# Patient Record
Sex: Male | Born: 1975
Health system: Southern US, Community
[De-identification: ages and names within clinical notes are randomized; demographics above are authoritative.]

## PROBLEM LIST (undated history)

## (undated) DIAGNOSIS — T7840XA Allergy, unspecified, initial encounter: Secondary | ICD-10-CM

## (undated) DIAGNOSIS — I10 Essential (primary) hypertension: Secondary | ICD-10-CM

## (undated) DIAGNOSIS — K5909 Other constipation: Secondary | ICD-10-CM

## (undated) DIAGNOSIS — K5790 Diverticulosis of intestine, part unspecified, without perforation or abscess without bleeding: Secondary | ICD-10-CM

## (undated) DIAGNOSIS — E739 Lactose intolerance, unspecified: Secondary | ICD-10-CM

## (undated) DIAGNOSIS — K5792 Diverticulitis of intestine, part unspecified, without perforation or abscess without bleeding: Secondary | ICD-10-CM

## (undated) DIAGNOSIS — R002 Palpitations: Secondary | ICD-10-CM

## (undated) DIAGNOSIS — I82409 Acute embolism and thrombosis of unspecified deep veins of unspecified lower extremity: Secondary | ICD-10-CM

## (undated) DIAGNOSIS — J45909 Unspecified asthma, uncomplicated: Secondary | ICD-10-CM

## (undated) DIAGNOSIS — E785 Hyperlipidemia, unspecified: Secondary | ICD-10-CM

## (undated) HISTORY — DX: Unspecified asthma, uncomplicated: J45.909

## (undated) HISTORY — DX: Diverticulosis of intestine, part unspecified, without perforation or abscess without bleeding: K57.90

## (undated) HISTORY — DX: Acute embolism and thrombosis of unspecified deep veins of unspecified lower extremity: I82.409

## (undated) HISTORY — PX: JOINT REPLACEMENT: SHX530

## (undated) HISTORY — PX: COLONOSCOPY: SHX174

## (undated) HISTORY — DX: Other constipation: K59.09

## (undated) HISTORY — DX: Diverticulitis of intestine, part unspecified, without perforation or abscess without bleeding: K57.92

## (undated) HISTORY — DX: Hyperlipidemia, unspecified: E78.5

## (undated) HISTORY — PX: OTHER SURGICAL HISTORY: SHX169

## (undated) HISTORY — DX: Lactose intolerance, unspecified: E73.9

## (undated) HISTORY — PX: ROTATOR CUFF REPAIR: SHX139

## (undated) HISTORY — PX: ESOPHAGOGASTRODUODENOSCOPY ENDOSCOPY: SHX5814

## (undated) HISTORY — DX: Allergy, unspecified, initial encounter: T78.40XA

---

## 2000-02-25 HISTORY — PX: BUNIONECTOMY: SHX129

## 2009-05-02 ENCOUNTER — Emergency Department (HOSPITAL_COMMUNITY): Admission: EM | Admit: 2009-05-02 | Discharge: 2009-05-02 | Payer: Self-pay | Admitting: Emergency Medicine

## 2010-05-20 LAB — POCT CARDIAC MARKERS
CKMB, poc: 2.9 ng/mL (ref 1.0–8.0)
CKMB, poc: 3.7 ng/mL (ref 1.0–8.0)
Myoglobin, poc: 188 ng/mL (ref 12–200)
Myoglobin, poc: 207 ng/mL (ref 12–200)
Troponin i, poc: <0.05 ng/mL (ref 0.00–0.09)

## 2010-05-20 LAB — DIFFERENTIAL
Basophils Relative: 0 % (ref 0–1)
Lymphocytes Relative: 31 % (ref 12–46)
Lymphs Abs: 2.1 10*3/uL (ref 0.7–4.0)
Neutro Abs: 4 10*3/uL (ref 1.7–7.7)

## 2010-05-20 LAB — CBC
HCT: 42.8 % (ref 39.0–52.0)
Hemoglobin: 14.2 g/dL (ref 13.0–17.0)
MCHC: 33.3 g/dL (ref 30.0–36.0)
MCV: 86.3 fL (ref 78.0–100.0)
RBC: 4.96 MIL/uL (ref 4.22–5.81)
WBC: 6.7 10*3/uL (ref 4.0–10.5)

## 2010-05-20 LAB — POCT I-STAT, CHEM 8
Calcium, Ion: 1.04 mmol/L — ABNORMAL LOW (ref 1.12–1.32)
Chloride: 107 mEq/L (ref 96–112)
Glucose, Bld: 90 mg/dL (ref 70–99)
HCT: 43 % (ref 39.0–52.0)
Potassium: 4.2 mEq/L (ref 3.5–5.1)
Sodium: 140 mEq/L (ref 135–145)

## 2017-04-20 ENCOUNTER — Other Ambulatory Visit: Payer: Self-pay | Admitting: Family Medicine

## 2017-04-20 DIAGNOSIS — M75100 Unspecified rotator cuff tear or rupture of unspecified shoulder, not specified as traumatic: Secondary | ICD-10-CM

## 2017-04-22 ENCOUNTER — Ambulatory Visit
Admission: RE | Admit: 2017-04-22 | Discharge: 2017-04-22 | Disposition: A | Discharge status: Home / Self Care | Payer: BC Managed Care – PPO | Source: Ambulatory Visit | Attending: Family Medicine | Admitting: Family Medicine

## 2017-04-22 DIAGNOSIS — M75100 Unspecified rotator cuff tear or rupture of unspecified shoulder, not specified as traumatic: Secondary | ICD-10-CM

## 2017-04-24 ENCOUNTER — Other Ambulatory Visit: Payer: Self-pay

## 2017-05-22 ENCOUNTER — Other Ambulatory Visit (HOSPITAL_BASED_OUTPATIENT_CLINIC_OR_DEPARTMENT_OTHER): Payer: Self-pay | Admitting: Family Medicine

## 2017-05-22 ENCOUNTER — Ambulatory Visit (HOSPITAL_BASED_OUTPATIENT_CLINIC_OR_DEPARTMENT_OTHER): Admission: RE | Admit: 2017-05-22 | Payer: BC Managed Care – PPO | Source: Ambulatory Visit

## 2017-05-22 DIAGNOSIS — M79601 Pain in right arm: Secondary | ICD-10-CM

## 2017-05-22 DIAGNOSIS — M7989 Other specified soft tissue disorders: Principal | ICD-10-CM

## 2017-05-23 ENCOUNTER — Emergency Department (HOSPITAL_BASED_OUTPATIENT_CLINIC_OR_DEPARTMENT_OTHER): Payer: BC Managed Care – PPO

## 2017-05-23 ENCOUNTER — Ambulatory Visit (HOSPITAL_BASED_OUTPATIENT_CLINIC_OR_DEPARTMENT_OTHER)
Admission: RE | Admit: 2017-05-23 | Discharge: 2017-05-23 | Disposition: A | Discharge status: Home / Self Care | Payer: BC Managed Care – PPO | Source: Ambulatory Visit | Attending: Family Medicine | Admitting: Family Medicine

## 2017-05-23 ENCOUNTER — Encounter (HOSPITAL_BASED_OUTPATIENT_CLINIC_OR_DEPARTMENT_OTHER): Payer: Self-pay

## 2017-05-23 ENCOUNTER — Emergency Department (HOSPITAL_BASED_OUTPATIENT_CLINIC_OR_DEPARTMENT_OTHER)
Admission: EM | Admit: 2017-05-23 | Discharge: 2017-05-23 | Disposition: A | Discharge status: Home / Self Care | Payer: BC Managed Care – PPO | Attending: Emergency Medicine | Admitting: Emergency Medicine

## 2017-05-23 ENCOUNTER — Other Ambulatory Visit: Payer: Self-pay

## 2017-05-23 DIAGNOSIS — I82621 Acute embolism and thrombosis of deep veins of right upper extremity: Secondary | ICD-10-CM

## 2017-05-23 DIAGNOSIS — M7989 Other specified soft tissue disorders: Principal | ICD-10-CM

## 2017-05-23 DIAGNOSIS — I82890 Acute embolism and thrombosis of other specified veins: Secondary | ICD-10-CM | POA: Insufficient documentation

## 2017-05-23 DIAGNOSIS — R000 Tachycardia, unspecified: Secondary | ICD-10-CM | POA: Diagnosis not present

## 2017-05-23 DIAGNOSIS — I10 Essential (primary) hypertension: Secondary | ICD-10-CM | POA: Diagnosis not present

## 2017-05-23 DIAGNOSIS — M79601 Pain in right arm: Secondary | ICD-10-CM

## 2017-05-23 DIAGNOSIS — I82411 Acute embolism and thrombosis of right femoral vein: Secondary | ICD-10-CM | POA: Insufficient documentation

## 2017-05-23 DIAGNOSIS — R2231 Localized swelling, mass and lump, right upper limb: Secondary | ICD-10-CM | POA: Diagnosis present

## 2017-05-23 HISTORY — DX: Essential (primary) hypertension: I10

## 2017-05-23 LAB — BASIC METABOLIC PANEL
Anion gap: 9 (ref 5–15)
BUN: 17 mg/dL (ref 6–20)
CALCIUM: 9.1 mg/dL (ref 8.9–10.3)
CO2: 25 mmol/L (ref 22–32)
CREATININE: 1.01 mg/dL (ref 0.61–1.24)
Chloride: 104 mmol/L (ref 101–111)
GFR calc Af Amer: >60 mL/min (ref >60)
GFR calc non Af Amer: >60 mL/min (ref >60)
GLUCOSE: 114 mg/dL — AB (ref 65–99)
Potassium: 3.8 mmol/L (ref 3.5–5.1)
Sodium: 138 mmol/L (ref 135–145)

## 2017-05-23 MED ORDER — RIVAROXABAN (XARELTO) VTE STARTER PACK (15 & 20 MG): ORAL_TABLET | ORAL | 0 refills | Status: DC

## 2017-05-23 MED ORDER — APIXABAN (ELIQUIS) EDUCATION KIT FOR DVT/PE PATIENTS: PACK | 0 refills | Status: DC

## 2017-05-23 MED ORDER — RIVAROXABAN 15 MG PO TABS
15.0000 mg | ORAL_TABLET | Freq: Once | ORAL | Status: AC
  Administered 2017-05-23: 15 mg via ORAL
  Filled 2017-05-23: qty 1

## 2017-05-23 MED ORDER — IOPAMIDOL (ISOVUE-370) INJECTION 76%
100.0000 mL | Freq: Once | INTRAVENOUS | Status: AC | PRN
  Administered 2017-05-23: 100 mL via INTRAVENOUS

## 2017-05-23 NOTE — ED Notes (Signed)
ED Provider at bedside. 

## 2017-05-23 NOTE — ED Triage Notes (Signed)
Pt with recent rotator cuff surgery on 3/11. Upper arm and forearm tenderness x 1 week. Had US performed today that showed DVT and superficial clot.

## 2017-05-23 NOTE — ED Provider Notes (Addendum)
[facility]ERGENCY DEPARTMENT Provider Note   CSN: [id number] Arrival date & time: 05/23/17  1642     History   Chief Complaint Chief Complaint  Patient presents with  . Arm Pain    HPI Mark Alvarado is a 42 y.o. male.  Complaint is DVT of right arm on ultrasound  HPI 42 year old male.  He underwent arthroscopic right rotator cuff repair 19 days ago.  Over the last 4-5 days had increasing pain and swelling of his right upper, and lower arm.  No numbness.  Wears a ring on his right hand.  This is not been tighter markedly swollen.  No numbness.  No difficulty with use of the extremity.  He is undergoing physical therapy.  His shoulder pain has improved markedly.  Past Medical History:  Diagnosis Date  . Hypertension     There are no active problems to display for this patient.   Past Surgical History:  Procedure Laterality Date  . JOINT REPLACEMENT          Home Medications    Prior to Admission medications   Medication Sig Start Date End Date Taking? Authorizing Provider  apixaban (ELIQUIS) KIT 10 mg po bid x 7 days, then 5 mg po bid x 30 days 05/23/17   Tanna Furry, MD    Family History No family history on file.  Social History Social History   Tobacco Use  . Smoking status: Never Smoker  . Smokeless tobacco: Never Used  Substance Use Topics  . Alcohol use: Yes  . Drug use: Not on file     Allergies   Patient has no known allergies.   Review of Systems Review of Systems  Constitutional: Negative for appetite change, chills, diaphoresis, fatigue and fever.  HENT: Negative for mouth sores, sore throat and trouble swallowing.   Eyes: Negative for visual disturbance.  Respiratory: Negative for cough, chest tightness, shortness of breath and wheezing.   Cardiovascular: Negative for chest pain.  Gastrointestinal: Negative for abdominal distention, abdominal pain, diarrhea, nausea and vomiting.  Endocrine: Negative for polydipsia, polyphagia  and polyuria.  Genitourinary: Negative for dysuria, frequency and hematuria.  Musculoskeletal: Negative for gait problem.       Right upper, and lower arm pain and swelling.  Skin: Negative for color change, pallor and rash.  Neurological: Negative for dizziness, syncope, light-headedness and headaches.  Hematological: Does not bruise/bleed easily.  Psychiatric/Behavioral: Negative for behavioral problems and confusion.     Physical Exam Updated Vital Signs BP (!) 144/97 (BP Location: Left Arm)   Pulse 100   Temp 98.4 F (36.9 C) (Oral)   Resp 18   Ht 6' (1.829 m)   Wt 123.4 kg (272 lb)   SpO2 96%   BMI 36.89 kg/m   Physical Exam  Constitutional: He is oriented to person, place, and time. He appears well-developed and well-nourished. No distress.  HENT:  Head: Normocephalic.  Eyes: Pupils are equal, round, and reactive to light. Conjunctivae are normal. No scleral icterus.  Neck: Normal range of motion. Neck supple. No thyromegaly present.  Cardiovascular: Normal rate and regular rhythm. Exam reveals no gallop and no friction rub.  No murmur heard. Pulmonary/Chest: Effort normal and breath sounds normal. No respiratory distress. He has no wheezes. He has no rales.  Abdominal: Soft. Bowel sounds are normal. He exhibits no distension. There is no tenderness. There is no rebound.  Musculoskeletal: Normal range of motion.  Subtle swelling of the right upper, and lower arm  on the right.  No swelling in the hand.  Normal pulses.  Soft compartments.  Normal strength and sensation to radial, ulnar, and median distributions.  Neurological: He is alert and oriented to person, place, and time.  Skin: Skin is warm and dry. No rash noted.  Psychiatric: He has a normal mood and affect. His behavior is normal.     ED Treatments / Results  Labs (all labs ordered are listed, but only abnormal results are displayed) Labs Reviewed  BASIC METABOLIC PANEL - Abnormal; Notable for the  following components:      Result Value   Glucose, Bld 114 (*)    All other components within normal limits    EKG None  Radiology Ct Angio Chest Pe W And/or Wo Contrast  Result Date: 05/23/2017 CLINICAL DATA:  Right upper extremity DVT on Doppler evaluation from earlier today. Recent rotator cuff surgery. EXAM: CT ANGIOGRAPHY CHEST WITH CONTRAST TECHNIQUE: Multidetector CT imaging of the chest was performed using the standard protocol during bolus administration of intravenous contrast. Multiplanar CT image reconstructions and MIPs were obtained to evaluate the vascular anatomy. CONTRAST:  137m ISOVUE-370 IOPAMIDOL (ISOVUE-370) INJECTION 76% COMPARISON:  05/02/2009 chest radiograph. FINDINGS: Cardiovascular: The study is low quality for the evaluation of pulmonary embolism, limited by significant motion degradation and by poor contrast opacification of the pulmonary arteries. There are no convincing filling defects in the central pulmonary arteries. The lobar, segmental and subsegmental pulmonary arteries are poorly evaluated on this scan. Normal course and caliber of the thoracic aorta. Main pulmonary artery diameter 3.0 cm, top-normal. Normal heart size. No significant pericardial fluid/thickening. Mediastinum/Nodes: No discrete thyroid nodules. Unremarkable esophagus. No pathologically enlarged axillary, mediastinal or hilar lymph nodes. Lungs/Pleura: No pneumothorax. No pleural effusion. No acute consolidative airspace disease, lung masses or significant pulmonary nodules. Upper abdomen: No acute abnormality. Musculoskeletal: No aggressive appearing focal osseous lesions. Mild gynecomastia, asymmetric to the right. Mild thoracic spondylosis. Review of the MIP images confirms the above findings. IMPRESSION: 1. Technically very limited scan. No evidence of central pulmonary embolism. 2. No active pulmonary disease. 3. Mild gynecomastia, asymmetric to the right. Electronically Signed   By: JIlona SorrelM.D.   On: 05/23/2017 18:10   [place]Venous Img Upper Uni Right  Result Date: 05/23/2017 CLINICAL DATA:  Right forearm burning sensation, pain EXAM: RIGHT UPPER EXTREMITY VENOUS DOPPLER ULTRASOUND TECHNIQUE: Gray-scale sonography with graded compression, as well as color Doppler and duplex ultrasound were performed to evaluate the upper extremity deep venous system from the level of the subclavian vein and including the jugular, axillary, basilic, radial, ulnar and upper cephalic vein. Spectral Doppler was utilized to evaluate flow at rest and with distal augmentation maneuvers. COMPARISON:  None. FINDINGS: Contralateral Subclavian Vein: Respiratory phasicity is normal and symmetric with the symptomatic side. No evidence of thrombus. Normal compressibility. Internal Jugular Vein: No evidence of thrombus. Normal compressibility, respiratory phasicity and response to augmentation. Subclavian Vein: No evidence of thrombus. Normal compressibility, respiratory phasicity and response to augmentation. Axillary Vein: No evidence of thrombus. Normal compressibility, respiratory phasicity and response to augmentation. Cephalic Vein: Hypoechoic occlusive appearing thrombus in the cephalic vein at the antecubital fossa extending into the upper arm. More peripheral cephalic vein in the forearm is patent. Basilic Vein: No evidence of thrombus. Normal compressibility, respiratory phasicity and response to augmentation. Brachial Veins: Hypoechoic nonocclusive intraluminal thrombus. Vessel is partially compressible. Thrombus appears acute and extends throughout the right brachial veins. Radial Veins: No evidence of thrombus. Normal compressibility,  respiratory phasicity and response to augmentation. Ulnar Veins: No evidence of thrombus. Normal compressibility, respiratory phasicity and response to augmentation. Venous Reflux:  None visualized. Other Findings:  None visualized. IMPRESSION: Positive exam for acute appearing right  upper extremity nonocclusive brachial DVT without propagation into the axillary and subclavian veins. Associated occlusive superficial thrombosis of the cephalic vein at the antecubital fossa. Electronically Signed   By: Jerilynn Mages.  Shick M.D.   On: 05/23/2017 15:19    Procedures Procedures (including critical care time)  Medications Ordered in ED Medications  iopamidol (ISOVUE-370) 76 % injection 100 mL (100 mLs Intravenous Contrast Given 05/23/17 1725)     Initial Impression / Assessment and Plan / ED Course  I have reviewed the triage vital signs and the nursing notes.  Pertinent labs & imaging results that were available during my care of the patient were reviewed by me and considered in my medical decision making (see chart for details).   Does have resting tachycardia.  Is not febrile.  CT angios limited by size and motion.  However, has venous NO central PE.  I had a discussion with him about the limited exam.  I am comfortable with him being discharged without further studies.  If he does have small peripheral PE he is not hypoxemic.  Shows no signs or symptoms suggestive of strain.  Is not dyspneic.  He is being treated with blood thinners.  Will use Xarelto.  Ask for surgery follow-up.  ER with acute changes.  Gust with him risk and benefits of Xarelto.  We discussed avoiding aspirin anti-inflammatories.  We discussed minimizing his risk for trauma and ER follow-up with bleeding of or trauma of any type.  Final Clinical Impressions(s) / ED Diagnoses   Final diagnoses:  Acute deep vein thrombosis (DVT) of femoral vein of right lower extremity [facility]    ED Discharge Orders        Ordered    apixaban (ELIQUIS) KIT     05/23/17 1842        Tanna Furry, MD 05/23/17 1846    Tanna Furry, MD 05/26/17 0008

## 2017-05-23 NOTE — Discharge Instructions (Addendum)
Xarelto prescription as prescribed. Do not take aspirin, or any anti-inflammatories. Tylenol for pain. Follow-up with Dr. Donzetta Matters vascular surgery.  Call for appointment.

## 2017-06-04 ENCOUNTER — Other Ambulatory Visit: Payer: Self-pay

## 2017-06-04 ENCOUNTER — Ambulatory Visit: Payer: BC Managed Care – PPO | Admitting: Vascular Surgery

## 2017-06-04 ENCOUNTER — Encounter: Payer: Self-pay | Admitting: Vascular Surgery

## 2017-06-04 VITALS — BP 130/83 | HR 96 | Resp 20 | Ht 72.0 in | Wt 272.0 lb

## 2017-06-04 DIAGNOSIS — I82621 Acute embolism and thrombosis of deep veins of right upper extremity: Secondary | ICD-10-CM | POA: Diagnosis not present

## 2017-06-04 NOTE — Progress Notes (Signed)
Patient ID: STELLAN VICK, male   DOB: [birth year], 42 y.o.   MRN: [id number]  Reason for Consult: New Patient (Initial Visit) (eval DVT RU )   Referred by Leighton Ruff, MD  Subjective:     HPI:  ALYX GEE is a 42 y.o. male who had a injury of his rotator cuff while doing bench press and then subsequently underwent rotator cuff repair in March.  He then presented to his primary care doctor having nonspecific pains in his right upper arm with associated swelling.  Ultrasound at that time demonstrated a DVT as well as superficial venous thrombosis in his right upper extremity.  He was prescribed Xarelto for 3 months which he is still taking.  At the time he is also taking aspirin for perioperative pain control.  Since that time his swelling has improved and nearly resolved.  His only complaint at this time is numbness on the medial aspect of his right upper arm.  He does not have any vaginal or family history of DVT.  He is no longer taking aspirin only on Xarelto.  He states that he does not have any medical problems.  Past Medical History:  Diagnosis Date  . DVT (deep venous thrombosis) ([place])   . Hypertension    History reviewed. No pertinent family history. Past Surgical History:  Procedure Laterality Date  . JOINT REPLACEMENT      Short Social History:  Social History   Tobacco Use  . Smoking status: Never Smoker  . Smokeless tobacco: Never Used  Substance Use Topics  . Alcohol use: Yes    No Known Allergies  Current Outpatient Medications  Medication Sig Dispense Refill  . Acetaminophen (TYLENOL EXTRA STRENGTH PO) Take by mouth.    . methocarbamol (ROBAXIN) 500 MG tablet TK 1 T PO Q 8 H PRF SPASM  0  . oxyCODONE-acetaminophen (PERCOCET/ROXICET) 5-325 MG tablet TK 1 TO 2 TS PO Q 4 TO 6 HOURS PRN FOR SEVERE PAIN  0  . Rivaroxaban 15 & 20 MG TBPK Take as directed on package: Start with one 61m tablet by mouth twice a day with food. On Day 22, switch to one 224mtablet  once a day with food. 51 each 0  . apixaban (ELIQUIS) KIT 10 mg po bid x 7 days, then 5 mg po bid x 30 days (Patient not taking: Reported on 06/04/2017) 1 kit 0   No current facility-administered medications for this visit.     Review of Systems  Constitutional:  Constitutional negative. HENT: HENT negative.  Eyes: Eyes negative.  Respiratory: Respiratory negative.  Cardiovascular: Cardiovascular negative.  GI: Gastrointestinal negative.  Musculoskeletal: Positive for joint pain.  Neurological: Neurological negative. Hematologic: Hematologic/lymphatic negative.  Psychiatric: Psychiatric negative.        Objective:  Objective   Vitals:   06/04/17 1443  BP: 130/83  Pulse: 96  Resp: 20  SpO2: 95%  Weight: 272 lb (123.4 kg)  Height: 6' (1.829 m)   Body mass index is 36.89 kg/m.  Physical Exam  Constitutional: He is oriented to person, place, and time. He appears well-developed and well-nourished.  HENT:  Head: Normocephalic.  Eyes: Pupils are equal, round, and reactive to light.  Neck: Normal range of motion.  Cardiovascular: Normal rate.  Pulmonary/Chest: Effort normal.  Abdominal: Soft.  Musculoskeletal: Normal range of motion. He exhibits no edema.  Neurological: He is alert and oriented to person, place, and time.  Skin: Skin is warm and dry.  Psychiatric:  He has a normal mood and affect. His behavior is normal. Judgment and thought content normal.    Data: I reviewed his right upper extremity venous duplex which demonstrated a brachial vein thrombus as well as a superficial venous thrombus in his cephalic vein on the right.     Assessment/Plan:     42 year old male with a history of right arm rotator cuff repair and subsequent deep venous thrombosis in his right upper extremity.  He is now on Xarelto with a plan to take for 3 months.  We discussed with him that this is a provoked DVT and he does not require longer anticoagulation but that given that he now has  a DVT he is at high risk of future DVT as well.  From this standpoint further surgeries should consider perioperative anticoagulation and he also he needs to take care when traveling and remain up-to-date on his cancer screenings throughout time.  I would recommend aspirin baby 81 mg for lifelong or until the risks outweigh the benefits particularly after he stopped Xarelto in 3 months.  If he has swelling of the right upper extremity he can wear gentle compression.  I can see him on an as-needed basis.     Waynetta Sandy MD [facility]

## 2017-06-15 ENCOUNTER — Telehealth: Payer: Self-pay | Admitting: Hematology and Oncology

## 2017-06-15 NOTE — Telephone Encounter (Signed)
Appointment scheduled Patient notified of date/time/location/phone number

## 2017-06-18 ENCOUNTER — Telehealth: Payer: Self-pay | Admitting: Hematology and Oncology

## 2017-06-18 ENCOUNTER — Encounter: Payer: Self-pay | Admitting: Hematology and Oncology

## 2017-06-18 NOTE — Telephone Encounter (Signed)
Pt's appt has been moved to 11am to see Dr. Lebron Conners. Pt was ok with the appt change. Letter mailed.

## 2017-06-30 ENCOUNTER — Telehealth: Payer: Self-pay | Admitting: Hematology and Oncology

## 2017-06-30 ENCOUNTER — Encounter: Payer: BC Managed Care – PPO | Admitting: Hematology and Oncology

## 2017-06-30 ENCOUNTER — Inpatient Hospital Stay: Payer: BC Managed Care – PPO | Admitting: Hematology and Oncology

## 2017-06-30 ENCOUNTER — Telehealth: Payer: Self-pay

## 2017-06-30 NOTE — Telephone Encounter (Signed)
Per patient request to change appointment date due to a meeting at his job. Per 5/7 phone message return

## 2017-06-30 NOTE — Telephone Encounter (Signed)
Tried to call regarding voicemail  °

## 2017-07-07 ENCOUNTER — Inpatient Hospital Stay: Payer: BC Managed Care – PPO | Attending: Hematology and Oncology | Admitting: Hematology and Oncology

## 2017-07-07 ENCOUNTER — Encounter: Payer: Self-pay | Admitting: Hematology and Oncology

## 2017-07-07 VITALS — BP 136/85 | HR 82 | Temp 98.4°F | Resp 17 | Ht 72.0 in | Wt 275.2 lb

## 2017-07-07 DIAGNOSIS — Z7901 Long term (current) use of anticoagulants: Secondary | ICD-10-CM | POA: Insufficient documentation

## 2017-07-07 DIAGNOSIS — Z809 Family history of malignant neoplasm, unspecified: Secondary | ICD-10-CM | POA: Insufficient documentation

## 2017-07-07 DIAGNOSIS — I82621 Acute embolism and thrombosis of deep veins of right upper extremity: Secondary | ICD-10-CM | POA: Insufficient documentation

## 2017-07-07 DIAGNOSIS — I10 Essential (primary) hypertension: Secondary | ICD-10-CM

## 2017-07-07 DIAGNOSIS — I8290 Acute embolism and thrombosis of unspecified vein: Secondary | ICD-10-CM

## 2017-07-07 DIAGNOSIS — I82611 Acute embolism and thrombosis of superficial veins of right upper extremity: Secondary | ICD-10-CM | POA: Diagnosis not present

## 2017-07-08 ENCOUNTER — Telehealth: Payer: Self-pay | Admitting: Hematology and Oncology

## 2017-07-08 NOTE — Telephone Encounter (Signed)
Return if symptoms worsen or fail to improve.per 5/15 los

## 2017-07-23 DIAGNOSIS — Z86718 Personal history of other venous thrombosis and embolism: Secondary | ICD-10-CM | POA: Insufficient documentation

## 2017-07-23 DIAGNOSIS — I8290 Acute embolism and thrombosis of unspecified vein: Secondary | ICD-10-CM | POA: Insufficient documentation

## 2017-07-23 NOTE — Progress Notes (Signed)
[name]:  Assessment: VTE (venous thromboembolism) 42 y.o. male with clearly provoked venous thromboembolism in the right upper extremity following joint surgery on the same arm.  Started on therapeutic anticoagulation with symptom resolution.  No previous history of venous thrombosis noted.  Recommendations: - Recommend 3 months of therapeutic anticoagulation on account of small volume and provoked nature of the thrombus. - No need for additional thrombophilia evaluation at this time. - Return to our clinic as needed.   Voice recognition software was used and creation of this note. Despite my best effort at editing the text, some misspelling/errors may have occurred. No orders of the defined types were placed in this encounter.   All questions were answered. . The patient knows to call the clinic with any problems, questions or concerns.  This note was electronically signed.    History of Presenting Illness Mark Alvarado 42 y.o. presenting to the [facility] for recent history of deep vein thrombosis in the right upper extremity, referred by Dr. Leighton Alvarado.  The exact reason for referral is not clear from the provided documents.  I assume it has to do with recommendations regarding the duration of anticoagulation warranted for the presentation.  Patient's past medical history is significant for history of hypertension, colon polyps with last colonoscopy obtained in 2009.  Patient has family history of cancer in his sister, but he does not know the type.  Patient is a non-smoker and does not drink alcohol to excess.  Presenting symptoms and labs/evaluations are outlined below.  Since initiation of anticoagulation, patient has had complete resolution of the symptoms on the affected extremity.  Oncological/hematological History: **VTE, DVT RtUE: --Event #1, 05/22/17: Presented with burning pain in the forearm and antecubital tenderness following a  rotator cuff surgery repair on 05/04/2017.  --Doppler US RtUE, 05/23/17: Occlusive thrombus of the cephalic vein with additional nonocclusive thrombus in the brachial vein.  --CTA Chest, 05/23/17: No evidence of central pulmonary embolism  --Treatment:    Apixaban, 03/30-04/11/19   Rivaroxaban, 06/04/17-   Medical History: Past Medical History:  Diagnosis Date  . DVT (deep venous thrombosis) ([place])   . Hypertension     Surgical History: Past Surgical History:  Procedure Laterality Date  . JOINT REPLACEMENT    . ROTATOR CUFF REPAIR Right     Family History: Family History  Problem Relation Age of Onset  . Cancer Mother   . Cancer Father   . Cancer Sister   . Cancer Maternal Aunt   . Cancer Paternal Uncle   . Cancer Maternal Grandmother     Social History: Social History   Socioeconomic History  . Marital status: Married    Spouse name: Not on file  . Number of children: Not on file  . Years of education: Not on file  . Highest education level: Not on file  Occupational History  . Not on file  Social Needs  . Financial resource strain: Not on file  . Food insecurity:    Worry: Not on file    Inability: Not on file  . Transportation needs:    Medical: Not on file    Non-medical: Not on file  Tobacco Use  . Smoking status: Never Smoker  . Smokeless tobacco: Never Used  Substance and Sexual Activity  . Alcohol use: Yes  . Drug use: Never  . Sexual activity: Yes  Lifestyle  . Physical activity:    Days per week: Not on file  Minutes per session: Not on file  . Stress: Not on file  Relationships  . Social connections:    Talks on phone: Not on file    Gets together: Not on file    Attends religious service: Not on file    Active member of club or organization: Not on file    Attends meetings of clubs or organizations: Not on file    Relationship status: Not on file  . Intimate partner violence:    Fear of current or ex partner: Not on file     Emotionally abused: Not on file    Physically abused: Not on file    Forced sexual activity: Not on file  Other Topics Concern  . Not on file  Social History Narrative  . Not on file    Allergies: No Known Allergies  Medications:  Current Outpatient Medications  Medication Sig Dispense Refill  . Acetaminophen (TYLENOL EXTRA STRENGTH PO) Take by mouth.    Marland Kitchen apixaban (ELIQUIS) KIT 10 mg po bid x 7 days, then 5 mg po bid x 30 days (Patient not taking: Reported on 06/04/2017) 1 kit 0  . methocarbamol (ROBAXIN) 500 MG tablet TK 1 T PO Q 8 H PRF SPASM  0  . oxyCODONE-acetaminophen (PERCOCET/ROXICET) 5-325 MG tablet TK 1 TO 2 TS PO Q 4 TO 6 HOURS PRN FOR SEVERE PAIN  0  . Rivaroxaban 15 & 20 MG TBPK Take as directed on package: Start with one '15mg'$  tablet by mouth twice a day with food. On Day 22, switch to one '20mg'$  tablet once a day with food. 51 each 0   No current facility-administered medications for this visit.     Review of Systems: Review of Systems  All other systems reviewed and are negative.    PHYSICAL EXAMINATION Blood pressure 136/85, pulse 82, temperature 98.4 F (36.9 C), temperature source Oral, resp. rate 17, height 6' (1.829 m), weight 275 lb 3.2 oz (124.8 kg), SpO2 98 %.  ECOG PERFORMANCE STATUS: 0 - Asymptomatic  Physical Exam  Constitutional: He is oriented to person, place, and time. He appears well-developed and well-nourished. No distress.  HENT:  Head: Normocephalic and atraumatic.  Mouth/Throat: Oropharynx is clear and moist. No oropharyngeal exudate.  Eyes: Pupils are equal, round, and reactive to light. Conjunctivae and EOM are normal. No scleral icterus.  Neck: No thyromegaly present.  Cardiovascular: Normal rate, regular rhythm, normal heart sounds and intact distal pulses. Exam reveals no gallop and no friction rub.  No murmur heard. Pulmonary/Chest: Effort normal and breath sounds normal. No stridor. No respiratory distress. He has no wheezes. He  has no rales.  Abdominal: Soft. Bowel sounds are normal. He exhibits no distension and no mass. There is no tenderness. There is no guarding.  Musculoskeletal: He exhibits no edema.  Lymphadenopathy:    He has no cervical adenopathy.  Neurological: He is alert and oriented to person, place, and time. He displays normal reflexes. No cranial nerve deficit or sensory deficit.  Skin: Skin is warm and dry. No rash noted. He is not diaphoretic. No erythema. No pallor.     LABORATORY DATA: I have personally reviewed the data as listed: No visits with results within 1 Week(s) from this visit.  Latest known visit with results is:  Admission on 05/23/2017, Discharged on 05/23/2017  Component Date Value Ref Range Status  . Sodium 05/23/2017 138  135 - 145 mmol/L Final  . Potassium 05/23/2017 3.8  3.5 - 5.1 mmol/L Final  .  Chloride 05/23/2017 104  101 - 111 mmol/L Final  . CO2 05/23/2017 25  22 - 32 mmol/L Final  . Glucose, Bld 05/23/2017 114* 65 - 99 mg/dL Final  . BUN 05/23/2017 17  6 - 20 mg/dL Final  . Creatinine, Ser 05/23/2017 1.01  0.61 - 1.24 mg/dL Final  . Calcium 05/23/2017 9.1  8.9 - 10.3 mg/dL Final  . GFR calc non Af Amer 05/23/2017 >60  >60 mL/min Final  . GFR calc Af Amer 05/23/2017 >60  >60 mL/min Final   Comment: (NOTE) The eGFR has been calculated using the CKD EPI equation. This calculation has not been validated in all clinical situations. eGFR's persistently <60 mL/min signify possible Chronic Kidney Disease.   Georgiann Hahn gap 05/23/2017 9  5 - 15 Final   Performed at [facility], [address], [place], [place] [address]         Ardath Sax, MD

## 2017-07-23 NOTE — Assessment & Plan Note (Signed)
42 y.o. male with clearly provoked venous thromboembolism in the right upper extremity following joint surgery on the same arm.  Started on therapeutic anticoagulation with symptom resolution.  No previous history of venous thrombosis noted.  Recommendations: - Recommend 3 months of therapeutic anticoagulation on account of small volume and provoked nature of the thrombus. - No need for additional thrombophilia evaluation at this time. - Return to our clinic as needed.

## 2017-10-23 LAB — LIPID PANEL
Cholesterol: 190 (ref 0–200)
HDL: 32 — AB (ref 35–70)
LDL Cholesterol: 124
Triglycerides: 172 — AB (ref 40–160)

## 2017-10-25 LAB — CBC: RBC: 5.09 (ref 3.87–5.11)

## 2017-10-25 LAB — BASIC METABOLIC PANEL
BUN: 13 (ref 4–21)
CO2: 28 — AB (ref 13–22)
Chloride: 104 (ref 99–108)
Creatinine: 1 (ref 0.6–1.3)
Glucose: 86
Potassium: 5.1 (ref 3.4–5.3)
Sodium: 138 (ref 137–147)

## 2017-10-25 LAB — COMPREHENSIVE METABOLIC PANEL
Calcium: 9.5 (ref 8.7–10.7)
GFR calc Af Amer: 99
GFR calc non Af Amer: 82

## 2017-10-25 LAB — CBC AND DIFFERENTIAL
HCT: 43 (ref 41–53)
Hemoglobin: 14.1 (ref 13.5–17.5)
Neutrophils Absolute: 52
WBC: 7.3

## 2017-10-25 LAB — HEPATIC FUNCTION PANEL
ALT: 22 (ref 10–40)
AST: 23 (ref 14–40)
Alkaline Phosphatase: 46 (ref 25–125)
Bilirubin, Total: 0.5

## 2018-10-21 ENCOUNTER — Other Ambulatory Visit (HOSPITAL_BASED_OUTPATIENT_CLINIC_OR_DEPARTMENT_OTHER): Payer: Self-pay | Admitting: Family Medicine

## 2018-10-21 DIAGNOSIS — M79602 Pain in left arm: Secondary | ICD-10-CM

## 2018-10-21 DIAGNOSIS — M79605 Pain in left leg: Secondary | ICD-10-CM

## 2018-10-22 ENCOUNTER — Ambulatory Visit (HOSPITAL_BASED_OUTPATIENT_CLINIC_OR_DEPARTMENT_OTHER)
Admission: RE | Admit: 2018-10-22 | Discharge: 2018-10-22 | Disposition: A | Discharge status: Home / Self Care | Payer: BC Managed Care – PPO | Source: Ambulatory Visit | Attending: Family Medicine | Admitting: Family Medicine

## 2018-10-22 ENCOUNTER — Other Ambulatory Visit: Payer: Self-pay

## 2018-10-22 DIAGNOSIS — M79602 Pain in left arm: Secondary | ICD-10-CM | POA: Insufficient documentation

## 2018-10-22 DIAGNOSIS — M79605 Pain in left leg: Secondary | ICD-10-CM

## 2018-10-27 LAB — BASIC METABOLIC PANEL
BUN: 18 (ref 4–21)
CO2: 29 — AB (ref 13–22)
Chloride: 105 (ref 99–108)
Creatinine: 1.1 (ref 0.6–1.3)
Glucose: 81
Potassium: 4.6 (ref 3.4–5.3)
Sodium: 141 (ref 137–147)

## 2018-10-27 LAB — HEPATIC FUNCTION PANEL
ALT: 18 (ref 10–40)
AST: 20 (ref 14–40)
Alkaline Phosphatase: 52 (ref 25–125)
Bilirubin, Total: 0.8

## 2018-10-27 LAB — COMPREHENSIVE METABOLIC PANEL
Calcium: 9.7 (ref 8.7–10.7)
GFR calc Af Amer: 87
GFR calc non Af Amer: 72

## 2018-10-29 LAB — LIPID PANEL
Cholesterol: 197 (ref 0–200)
HDL: 38 (ref 35–70)
LDL Cholesterol: 121
Triglycerides: 194 — AB (ref 40–160)

## 2019-04-28 ENCOUNTER — Ambulatory Visit: Payer: Self-pay | Attending: Family

## 2019-04-28 DIAGNOSIS — Z23 Encounter for immunization: Secondary | ICD-10-CM | POA: Insufficient documentation

## 2019-04-28 NOTE — Progress Notes (Signed)
   Covid-19 Vaccination Clinic  Name:  Mark Alvarado    MRN: [id number] DOB: 1975/08/10  04/28/2019  Mr. Mark Alvarado was observed post Covid-19 immunization for 15 minutes without incident. He was provided with Vaccine Information Sheet and instruction to access the V-Safe system.   Mr. Mark Alvarado was instructed to call 911 with any severe reactions post vaccine: Marland Kitchen Difficulty breathing  . Swelling of face and throat  . A fast heartbeat  . A bad rash all over body  . Dizziness and weakness   Immunizations Administered    Name Date Dose VIS Date Route   Moderna COVID-19 Vaccine 04/28/2019  1:21 PM 0.5 mL 01/25/2019 Intramuscular   Manufacturer: Moderna   Lot: [id number]   [id number]

## 2019-05-31 ENCOUNTER — Ambulatory Visit: Payer: Self-pay | Attending: Family

## 2019-05-31 DIAGNOSIS — Z23 Encounter for immunization: Secondary | ICD-10-CM

## 2019-05-31 NOTE — Progress Notes (Signed)
   Covid-19 Vaccination Clinic  Name:  Mark Alvarado    MRN: [id number] DOB: 01-22-1976  05/31/2019  Mark Alvarado was observed post Covid-19 immunization for 15 minutes without incident. He was provided with Vaccine Information Sheet and instruction to access the V-Safe system.   Mark Alvarado was instructed to call 911 with any severe reactions post vaccine: Marland Kitchen Difficulty breathing  . Swelling of face and throat  . A fast heartbeat  . A bad rash all over body  . Dizziness and weakness   Immunizations Administered    Name Date Dose VIS Date Route   Moderna COVID-19 Vaccine 05/31/2019  1:17 PM 0.5 mL 01/25/2019 Intramuscular   Manufacturer: Moderna   Lot: [id number]   [place][id number]

## 2019-12-23 NOTE — Patient Instructions (Addendum)
Health Maintenance Due  Topic Date Due   INFLUENZA VACCINE In office flu shot today Never done   If arm fails to continue to improve  Over the next few weeks- let me get you in with Dr. Tamala Julian or Dr. Georgina Snell of sports medicine  Lets schedule a physical 3-4 months and lets update bloodwork at that time.   Removed skin tags today. Monitor for bleeding/signs of infection such as expanding redness- let me know if this occurs  Work on 10-15 lbs weight loss by follow up. Consider F3 [place]. Ask Langley Gauss all about it!   We will call you within two weeks about your referral to GI/colonoscopy. If you do not hear within 3 weeks, give [place] a call.

## 2019-12-23 NOTE — Progress Notes (Signed)
Phone: [phone]   Subjective:  Patient presents today to establish care.  Prior patient of Dr. Drema Dallas (retiring).  Chief Complaint  Patient presents with  . New Patient (Initial Visit)    See problem oriented charting  Review of Systems  Constitutional: Negative for chills, fever and weight loss (no has gained 10 lbs).  HENT: Positive for hearing loss (thinks listening to music too loud for too long contributed) and tinnitus.   Eyes: Negative for blurred vision and double vision.  Respiratory: Positive for cough (with allergies). Negative for hemoptysis.   Cardiovascular: Negative for chest pain and palpitations.  Gastrointestinal: Negative for heartburn and nausea.  Genitourinary: Negative for dysuria and urgency.  Musculoskeletal: Negative for back pain and falls.       Feels weaker as not lifting like he had bene in past. Lifting some weights with dumbbells and bands- tear was with bench press  Skin: Negative for itching and rash.  Neurological: Negative for dizziness and headaches.  Endo/Heme/Allergies: Positive for environmental allergies. Negative for polydipsia. Does not bruise/bleed easily.  Psychiatric/Behavioral: Negative for depression and suicidal ideas.   The following were reviewed and entered/updated in epic: Past Medical History:  Diagnosis Date  . Asthma    As a child  . DVT (deep venous thrombosis) ([place])   . Hyperlipidemia   . Hypertension    Patient Active Problem List   Diagnosis Date Noted  . Hyperlipidemia 12/28/2019    Priority: Medium  . Essential hypertension 12/28/2019    Priority: Medium  . History of DVT (deep vein thrombosis) 07/23/2017    Priority: Medium  . Allergic rhinitis 12/28/2019    Priority: Low  . Childhood asthma 12/28/2019    Priority: Low  . GERD (gastroesophageal reflux disease) 12/28/2019    Priority: Low  . Heart murmur 12/28/2019    Priority: Low   Past Surgical History:  Procedure Laterality Date  . left knee  arthroscopic     1994- football injury   . ROTATOR CUFF REPAIR Right    march 2019  . ROTATOR CUFF REPAIR Left    Partial and AC joint repair december 2020    Family History  Problem Relation Age of Onset  . High Cholesterol Mother   . Hypertension Mother   . High Cholesterol Father   . Hypertension Father   . Kidney disease Father        agent orange exposure- 100% disability  . Colon cancer Sister        rare and typically find in men and boys- died in 110 months.   . Colon cancer Maternal Aunt   . Colon cancer Maternal Grandmother   . Alzheimer's disease Paternal Grandmother   . Healthy Sister   . Other Maternal Grandfather        accidental  . Heart attack Paternal Grandfather        early 55s  . Colon cancer Paternal Uncle     Medications- reviewed and updated No current outpatient medications on file.   No current facility-administered medications for this visit.    Allergies-reviewed and updated Allergies  Allergen Reactions  . Dust Mite Extract   . Grass Extracts [Gramineae Pollens]   . Pollen Extract     Social History   Social History Narrative   Married 2008. Thomasena Edis (2014), Ellard Artis (2017). Mother in law lives with them.       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 Oropharynx normal. TM normal. Eyes: sclera and lids normal,  PERRLA Neck: no thyromegaly, no cervical lymphadenopathy CV: RRR no murmurs rubs or gallops Lungs: CTAB no crackles, wheeze, rhonchi Abdomen: soft/nontender/nondistended/normal bowel sounds. No rebound or guarding.  Obesity noted Ext: no edema Skin: warm, dry Neuro: 5/5 strength in upper and lower extremities, normal gait, normal reflexes MSK: bruising under right upper arm without pain with palpation and good range of motion   Skin Tag Removal Procedure Note  PRE-OP DIAGNOSIS: Skin Tags POST-OP DIAGNOSIS: Same  Size of lesion: 14 lesions all with a base less than 5 mm Location of lesion 10 on left lower neck, 4 on right lower neck  PROCEDURE:  X 14 skin tag removals We discussed risks of procedure and obtained verbal consent . The area surrounding the skin lesion was prepared and draped in the usual sterile manner.  Removal Completed today. 0.5 cc of lidocaine with epinephrine used to anesthetize. Then betadine used to further cleanse before using pick ups to pick up lesion and derma blade to cut the base. Hemostasis achieved with pressure and then drysol if needed. Patient tolerated procedure well.   Followup:   Standard post-procedure care is explained and return precautions are given.    Assessment and Plan:   # Obesity  S: Patient stated that pre-covid he lost about 40 pounds and now he has gained 10 pounds. He stated that he is eating healthy but is unable to lose weight.   Got a book called a man, a pan, and a plan and lost 40 lbs on that diet initially. Then got ona plexus plan through a friend who is a doctor- hasnt been as consistent  Got down to 239 and had 220 in his sites.  Wt Readings from Last 3 Encounters:  12/28/19 246 lb 6.4 oz (111.8 kg)  07/07/17 275 lb 3.2 oz (124.8 kg)  06/04/17 272 lb (123.4 kg)   A/P: Patient asked about possible referral to nutritionist-I think this is perfectly reasonable.  We talked more about healthy eating/regular exercise and some  practical changes he can make including trying whole 30 or doing a fast type diet as well as cutting down on carbs (breads and pastas or weaknesses as well as sweets).  He asks about specific eating program such as intermittent fasting-those are certainly reasonable and work for some people.  Ultimately we decided to see how he does over the next 3 to 4 months and then come back for a physical and update blood work-depending on results consider referral to nutrition or healthy weight wellness program  #hypertension S: medication: none, controlled with weight loss from peak of 275 BP Readings from Last 3 Encounters:  12/28/19 118/74  07/07/17 136/85  06/04/17 130/83  A/P: Good control without meds-continue to focus on healthy lifestyle choices  #  hyperlipidemia S: Medication: None No results found for: CHOL, HDL, LDLCALC, LDLDIRECT, TRIG, CHOLHDL A/P: Reports history of being on statin-he has been on for some time-he had severe abdominal cramping with exercise on medication-we are to focus on healthy eating/regular exercise and recheck at physical  # Skin Tags S:He has a couple of skin tags on his left and right neck that he would like removed. There are 10 on left side of neck and 4 on right side of neck. When he is shaving or trimming his hairs these will sometimes get stuck/bleedand have been an increasing nuisance for him.  A/P: removal x 15 above . See procedure note above. Completed due to intermittent irritation with shaving/trimming hair  # Headache S:feels like combo of irregular eating and using earbuds for work. Headaches located in band from ear to ear essentially. 2-3x a week. Doesn't take anything for them. lasts no more than an hour- usually better after eating  A/P: encouraged patient to avoid regular meals as that seems to help- if new or worsening symptoms should return to see [place].    # Hearing Loss S: hearing test a few years ago. Was told did not need aids. Uses noise  cancelling headphones earpods at low level. Tries to be careful about volumes A/P: could refer back to ENT but declines for now  # bruising under right arm.  S:Felt a pop while playing golf on Friday and noted bruising under. Hurt at first in the tricep and going up to armpit. Pain resolved by next day but noted bruising under the arm- ice really helped. Seems to be improving.   A/P: no pain at present- wonder if he had a partial tear of a muscle but with no pain and bruise improving opted to monitor.    #immunizations-  Flu 2020, tetanus 2016.   # had covid dec 2020 but got vaccinated April 2021. Considering covid 19 booster- moderna. Discussed with both natural plus vaccination immunity he may have higher levels of protection- he is undecided at present  # [facility]_ colonoscopy 2016. Scheduled January 2021 Due to sisters history of colon cancer- he gets regular colonoscopies.  Dr. Penelope Coop retiring and he wants to establish with [facility]  Recommended follow up:  #Last CPE 2019 - agrees to return in 3-4 months for this.  Future Appointments  Date Time Provider [place]  04/25/2020 11:20 AM Marin Olp, MD [facility]   Time Spent: 58 minutes of total time (11:24 AM- 12:22 PM, additional time spent after this on procedure only. ) was spent on the date of the encounter performing the following actions: chart review prior to seeing the patient, obtaining history, performing a medically necessary exam, counseling on the treatment plan, placing orders, and documenting in our EHR.   Return precautions advised. Garret Reddish, MD

## 2019-12-28 ENCOUNTER — Encounter: Payer: Self-pay | Admitting: Family Medicine

## 2019-12-28 ENCOUNTER — Ambulatory Visit: Payer: 59 | Admitting: Family Medicine

## 2019-12-28 ENCOUNTER — Other Ambulatory Visit: Payer: Self-pay

## 2019-12-28 VITALS — BP 118/74 | HR 88 | Temp 97.6°F | Resp 18 | Ht 72.0 in | Wt 246.4 lb

## 2019-12-28 DIAGNOSIS — Z800 Family history of malignant neoplasm of digestive organs: Secondary | ICD-10-CM

## 2019-12-28 DIAGNOSIS — I10 Essential (primary) hypertension: Secondary | ICD-10-CM | POA: Insufficient documentation

## 2019-12-28 DIAGNOSIS — J309 Allergic rhinitis, unspecified: Secondary | ICD-10-CM | POA: Insufficient documentation

## 2019-12-28 DIAGNOSIS — R011 Cardiac murmur, unspecified: Secondary | ICD-10-CM | POA: Insufficient documentation

## 2019-12-28 DIAGNOSIS — K219 Gastro-esophageal reflux disease without esophagitis: Secondary | ICD-10-CM | POA: Insufficient documentation

## 2019-12-28 DIAGNOSIS — Z23 Encounter for immunization: Secondary | ICD-10-CM | POA: Diagnosis not present

## 2019-12-28 DIAGNOSIS — E669 Obesity, unspecified: Secondary | ICD-10-CM

## 2019-12-28 DIAGNOSIS — E785 Hyperlipidemia, unspecified: Secondary | ICD-10-CM | POA: Diagnosis not present

## 2019-12-28 DIAGNOSIS — L918 Other hypertrophic disorders of the skin: Secondary | ICD-10-CM | POA: Diagnosis not present

## 2019-12-28 DIAGNOSIS — Z86718 Personal history of other venous thrombosis and embolism: Secondary | ICD-10-CM

## 2019-12-28 DIAGNOSIS — Z1159 Encounter for screening for other viral diseases: Secondary | ICD-10-CM

## 2019-12-28 DIAGNOSIS — Z114 Encounter for screening for human immunodeficiency virus [HIV]: Secondary | ICD-10-CM

## 2019-12-28 DIAGNOSIS — J45909 Unspecified asthma, uncomplicated: Secondary | ICD-10-CM | POA: Insufficient documentation

## 2020-02-01 ENCOUNTER — Ambulatory Visit: Payer: 59 | Attending: Family

## 2020-02-01 DIAGNOSIS — Z23 Encounter for immunization: Secondary | ICD-10-CM

## 2020-02-21 ENCOUNTER — Encounter: Payer: Self-pay | Admitting: Gastroenterology

## 2020-03-13 ENCOUNTER — Ambulatory Visit: Payer: 59 | Admitting: Gastroenterology

## 2020-03-28 ENCOUNTER — Ambulatory Visit: Payer: 59 | Admitting: Gastroenterology

## 2020-03-29 ENCOUNTER — Other Ambulatory Visit (INDEPENDENT_AMBULATORY_CARE_PROVIDER_SITE_OTHER): Payer: 59

## 2020-03-29 ENCOUNTER — Encounter: Payer: Self-pay | Admitting: Physician Assistant

## 2020-03-29 ENCOUNTER — Ambulatory Visit: Payer: 59 | Admitting: Physician Assistant

## 2020-03-29 VITALS — BP 140/80 | HR 74 | Ht 72.0 in | Wt 254.0 lb

## 2020-03-29 DIAGNOSIS — Z800 Family history of malignant neoplasm of digestive organs: Secondary | ICD-10-CM

## 2020-03-29 DIAGNOSIS — K5730 Diverticulosis of large intestine without perforation or abscess without bleeding: Secondary | ICD-10-CM

## 2020-03-29 DIAGNOSIS — E739 Lactose intolerance, unspecified: Secondary | ICD-10-CM

## 2020-03-29 DIAGNOSIS — R152 Fecal urgency: Secondary | ICD-10-CM

## 2020-03-29 DIAGNOSIS — Z8379 Family history of other diseases of the digestive system: Secondary | ICD-10-CM | POA: Diagnosis not present

## 2020-03-29 DIAGNOSIS — K529 Noninfective gastroenteritis and colitis, unspecified: Secondary | ICD-10-CM

## 2020-03-29 LAB — SEDIMENTATION RATE: Sed Rate: 31 mm/hr — ABNORMAL HIGH (ref 0–15)

## 2020-03-29 MED ORDER — HYOSCYAMINE SULFATE 0.125 MG SL SUBL: SUBLINGUAL_TABLET | SUBLINGUAL | 6 refills | Status: DC

## 2020-03-29 MED ORDER — PLENVU 140 G PO SOLR: 1.0000 | ORAL | 0 refills | Status: DC

## 2020-03-29 NOTE — Patient Instructions (Addendum)
If you are age 47 or older, your body mass index should be between 23-30. Your Body mass index is 34.45 kg/m. If this is out of the aforementioned range listed, please consider follow up with your Primary Care Provider.  If you are age 69 or younger, your body mass index should be between 19-25. Your Body mass index is 34.45 kg/m. If this is out of the aformentioned range listed, please consider follow up with your Primary Care Provider.   Your provider has requested that you go to the basement level for lab work before leaving today. Press "B" on the elevator. The lab is located at the first door on the left as you exit the elevator.  You have been scheduled for a colonoscopy. Please follow written instructions given to you at your visit today.  Please pick up your prep supplies at the pharmacy within the next 1-3 days. If you use inhalers (even only as needed), please bring them with you on the day of your procedure.  START Hycosyamine dissolve 1 tablet on the tongue every 6 hours as needed for urgency. This has been sent to you pharmacy.  Follow up pending the results of your labs and Colonoscopy.  Thank you for entrusting me with your care and choosing [facility].  Amy Esterwood, PA-C

## 2020-03-29 NOTE — Progress Notes (Signed)
Subjective:    Patient ID: Mark Alvarado, male    DOB: 1975-04-23, 45 y.o.   MRN: [id number]  HPI Mark Alvarado is a pleasant 45 year old African-American male, new to GI today referred by low our primary care/Mark Alvarado for colon cancer screening, and also has complaint of chronic postprandial urgency and loose stool which has been present for many years. Patient has family history of colon cancer in his sister, deceased at a young age,, and also in a maternal aunt and maternal grandmother. Patient had colonoscopy done per Mark Alvarado at [place] in 2016 with finding of moderate diverticulosis in the sigmoid colon and descending colon and otherwise negative exam. He was to have interval follow-up at 5 years. He says around that time he had just started a new job and also had contracted Covid. He has been doing well over the past year. He has not noted any melena or hematochezia. Appetite has been good, weight is down intentionally about 25 lbs. over the past year or so. He says that he has had problems with postprandial urgency for bowel movements and loose stools which has been going on for at least the past 20 years if not longer. These usually occurs after every meal and he may have a couple of bowel movements after a meal. He has not been able to identify any certain triggers and this occurs whether he is eating at home or eating out. He does have a component of lactose intolerance and generally tries to avoid lactose altogether. He has no symptoms in between meals, no abdominal pain or cramping though he says his abdomen will be "rumbling" frequently. He has not ever been tried on a medication for the symptoms in the past.  Review of Systems Pertinent positive and negative review of systems were noted in the above HPI section.  All other review of systems was otherwise negative.  Outpatient Encounter Medications as of 03/29/2020  Medication Sig  . hyoscyamine (LEVSIN SL) 0.125 MG SL tablet Dissolve 1  tablet on the tongue every 6 hours 30 minutes-1 hour before meals as needed for urgency  . MULTIPLE VITAMIN PO Take 1 tablet by mouth daily.  Marland Kitchen PEG-KCl-NaCl-NaSulf-Na Asc-C (PLENVU) 140 g SOLR Take 1 kit by mouth as directed. Manufacturer's coupon Universal coupon code:BIN: P2366821; GROUP: UP10315945; PCN: [id number]; ID: [id number]; PAY NO MORE $50; NO prior authorization   No facility-administered encounter medications on file as of 03/29/2020.   Allergies  Allergen Reactions  . Dust Mite Extract   . Grass Extracts [Gramineae Pollens]   . Pollen Extract    Patient Active Problem List   Diagnosis Date Noted  . Diverticulosis of colon without hemorrhage 03/29/2020  . Family hx of colon cancer 03/29/2020  . Allergic rhinitis 12/28/2019  . Childhood asthma 12/28/2019  . GERD (gastroesophageal reflux disease) 12/28/2019  . Heart murmur 12/28/2019  . Hyperlipidemia 12/28/2019  . Essential hypertension 12/28/2019  . History of DVT (deep vein thrombosis) 07/23/2017   Social History   Socioeconomic History  . Marital status: Married    Spouse name: Not on file  . Number of children: Not on file  . Years of education: Not on file  . Highest education level: Not on file  Occupational History  . Not on file  Tobacco Use  . Smoking status: Never Smoker  . Smokeless tobacco: Never Used  Vaping Use  . Vaping Use: Never used  Substance and Sexual Activity  . Alcohol use: Yes  Comment: rare  . Drug use: Never  . Sexual activity: Yes    Partners: Female  Other Topics Concern  . Not on file  Social History Narrative   Married 2008. Mark Alvarado (2014), Mark Alvarado (2017). Mother in law lives with them.       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 [employer] position   Was Surveyor, mining at [employer]: time with family and sons, walking in mornings, date night once a week with wife, church   Social Determinants of Radio broadcast assistant Strain: Not on file  Food Insecurity: Not on file  Transportation Needs: Not on file  Physical Activity: Not on file  Stress: Not on file  Social Connections: Not on file  Intimate Partner Violence: Not on file    Mark Alvarado family history includes Alzheimer's disease in his paternal grandmother; Colon cancer in his maternal aunt, maternal grandmother, paternal uncle, and sister; Healthy in his sister; Heart attack in his paternal grandfather; High Cholesterol in his father and mother; Hypertension in his father and mother; Kidney disease in his father; Other in his maternal grandfather.      Objective:    Vitals:   03/29/20 0818  BP: 140/80  Pulse: 74  SpO2: 98%    Physical Exam Well-developed well-nourished  AA male  in no acute distress.Pleasant   Height, Weight,254 BMI 34.4  HEENT; nontraumatic normocephalic, EOMI, PE R LA, sclera anicteric. Oropharynx; not examined today Neck; supple, no JVD Cardiovascular; regular rate and rhythm with S1-S2, no murmur rub or gallop Pulmonary; Clear bilaterally Abdomen; soft, nontender, nondistended, no palpable mass or hepatosplenomegaly, bowel sounds are active Rectal; not done Skin; benign exam, no jaundice rash or appreciable lesions Extremities; no clubbing cyanosis or edema skin warm and dry Neuro/Psych; alert and oriented x4, grossly nonfocal mood and affect appropriate       Assessment & Plan:   #93 45 year old African-American male with family history of colon cancer in patient's sister diagnosed at age 39, also with history of colon cancer in a maternal grandmother and a maternal aunt. Patient had negative colonoscopy for polyps in 2016 (Mark Alvarado) and is overdue for interval follow-up  #2 diverticulosis #3 postprandial urgency and loose  stools chronic x20 years. He does have component of lactose intolerance and generally avoids lactose. I suspect this is IBS-D, rule out celiac disease, rule out pancreatic insufficiency, consider SIBO  #4 prior history of DVT  Plan; patient will be scheduled for colonoscopy with Dr. Silverio Decamp.  W ill plan for random biopsies of the colon given history of chronic loose stools   Procedure was discussed in detail with patient including indications risks and benefits and he is agreeable to proceed. Patient has completed COVID-19 vaccination . We will check TTG and IgA, fecal elastase Continue lactose-free diet Will be given a trial of Levsin sublingual 30 min to 1 hour AC which could be taken on a scheduled basis or for as needed use if he does not feel that he needs this regularly.  Joclynn Lumb Genia Harold PA-C 03/29/2020   Cc: Marin Olp, MD

## 2020-03-30 LAB — TISSUE TRANSGLUTAMINASE, IGA: (tTG) Ab, IgA: <1 U/mL

## 2020-03-30 LAB — IGA: Immunoglobulin A: 119 mg/dL (ref 47–310)

## 2020-04-05 LAB — PANCREATIC ELASTASE, FECAL: Pancreatic Elastase-1, Stool: >500 mcg/g

## 2020-04-23 NOTE — Progress Notes (Signed)
Reviewed and agree with documentation and assessment and plan. K. Veena Steffie Waggoner , MD   

## 2020-04-24 NOTE — Progress Notes (Signed)
Phone: [phone]    Subjective:  Patient presents today for their annual physical. Chief complaint-noted.   See problem oriented charting- ROS- full  review of systems was completed and negative  except for: tinnitus/hearing loss, appetite change  The following were reviewed and entered/updated in epic: Past Medical History:  Diagnosis Date  . Asthma    As a child  . DVT (deep venous thrombosis) ([place])   . Hyperlipidemia   . Hypertension    Patient Active Problem List   Diagnosis Date Noted  . Hyperlipidemia 12/28/2019    Priority: Medium  . Essential hypertension 12/28/2019    Priority: Medium  . History of DVT (deep vein thrombosis) 07/23/2017    Priority: Medium  . Allergic rhinitis 12/28/2019    Priority: Low  . Childhood asthma 12/28/2019    Priority: Low  . GERD (gastroesophageal reflux disease) 12/28/2019    Priority: Low  . Heart murmur 12/28/2019    Priority: Low  . Diverticulosis of colon without hemorrhage 03/29/2020  . Family hx of colon cancer 03/29/2020   Past Surgical History:  Procedure Laterality Date  . left knee arthroscopic     1994- football injury   . ROTATOR CUFF REPAIR Right    march 2019  . ROTATOR CUFF REPAIR Left    Partial and AC joint repair december 2020    Family History  Problem Relation Age of Onset  . High Cholesterol Mother   . Hypertension Mother   . High Cholesterol Father   . Hypertension Father   . Kidney disease Father        agent orange exposure- 100% disability  . Colon cancer Sister        rare and typically find in men and boys- died in 21 months.   . Colon cancer Maternal Aunt   . Colon cancer Maternal Grandmother   . Alzheimer's disease Paternal Grandmother   . Healthy Sister   . Other Maternal Grandfather        accidental  . Heart attack Paternal Grandfather        early 53s  . Colon cancer Paternal Uncle   . Pancreatic cancer Neg Hx   . Esophageal cancer Neg Hx   . Stomach cancer Neg Hx   .  Liver disease Neg Hx     Medications- reviewed and updated Current Outpatient Medications  Medication Sig Dispense Refill  . hyoscyamine (LEVSIN SL) 0.125 MG SL tablet Dissolve 1 tablet on the tongue every 6 hours 30 minutes-1 hour before meals as needed for urgency (Patient not taking: Reported on 04/25/2020) 60 tablet 6  . MULTIPLE VITAMIN PO Take 1 tablet by mouth daily. (Patient not taking: Reported on 04/25/2020)    . PEG-KCl-NaCl-NaSulf-Na Asc-C (PLENVU) 140 g SOLR Take 1 kit by mouth as directed. Manufacturer's coupon Universal coupon code:BIN: P2366821; GROUP: [id number]; PCN: [id number]; ID: [id number]; PAY NO MORE $50; NO prior authorization (Patient not taking: Reported on 04/25/2020) 1 each 0   No current facility-administered medications for this visit.    Allergies-reviewed and updated Allergies  Allergen Reactions  . Dust Mite Extract   . Grass Extracts [Gramineae Pollens]   . Pollen Extract     Social History   Social History Narrative   Married 2008. Thomasena Edis (2014), Ellard Artis (2017). Mother in law lives with them.       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 Oropharynx normal Neck: no thyromegaly CV: RRR no murmurs rubs or gallops Lungs: CTAB no crackles, wheeze, rhonchi Abdomen: soft/nontender/nondistended/normal bowel sounds. No rebound or guarding.  Ext: no edema Skin: warm,  dry Neuro: grossly normal, moves all extremities, PERRLA     Assessment and Plan:  45 y.o. male presenting for annual physical.  Health Maintenance counseling: 1. Anticipatory guidance: Patient counseled regarding regular dental exams -q6 months, eye exams - has appointment coming up soon but may get this moved up,  avoiding smoking and second hand smoke , limiting alcohol to 2 beverages per day- extremely rare glass of wine.   2. Risk factor reduction:  Advised patient of need for regular exercise and diet rich and fruits and vegetables to reduce risk of heart attack and stroke. Exercise- still doing 35 mins to an hour a day walking, working on a push up pyramid since January plus 100 push ups a day plus some hotel gym workouts. Diet-. Doing a program through church- he has been giving up sugary drinks since January- doing water and coffee only. Has still been doing sugary other items but he is going to cut those out some days in march/possibly whole month. Down from peak of 275.  Wt Readings from Last 3 Encounters:  04/25/20 251 lb 6.4 oz (114 kg)  03/29/20 254 lb (115.2 kg)  12/28/19 246 lb 6.4 oz (111.8 kg)  3. Immunizations/screenings/ancillary studies- HCV screen with labs Immunization History  Administered Date(s) Administered  . Influenza,inj,Quad PF,6+ Mos 12/28/2019  . Moderna Sars-Covid-2 Vaccination 04/28/2019, 05/31/2019, 02/01/2020  . Tdap 09/25/2014  4. Prostate cancer screening- will screen early due to being african [place] male.   5. Colon cancer screening - colonoscopy 2016 Dr. Penelope Coop told 5 year repeat. dad with precancerous polyps. Scheduled for tomorrow 6. Skin cancer screening/prevention- lower risk due to melanin content. advised regular sunscreen use or covering up. Denies worrisome, changing, or new skin lesions.  7. Testicular cancer screening- advised monthly self exams  8. STD screening- patient opts out as only with wife 9. Never smoker-   Status of chronic or  acute concerns   #hypertension S: medication: none Home readings #s:  Has cuff at home- home goal <135/85 BP Readings from Last 3 Encounters:  04/25/20 120/88  03/29/20 140/80  12/28/19 118/74  A/P: diet controlled but high normal  # GERD- vinegar helps if needed. No regular medication use- has a back up PPI it sounds like-perhaps some mild IBS- hyoscyamine as helped  #hyperlipidemia S: Medication: none. crestor in past but had bad abdominal pain with crunches and sounds like had high Ck  Lab Results  Component Value Date   CHOL 197 10/29/2018   HDL 38 10/29/2018   LDLCALC 121 10/29/2018   TRIG 194 (A) 10/29/2018   A/P: hopefully improved. Update lipids today  #history provoked DVT after rotator cuff repair 2019- was in upper arm-  no evidence of recurrence  Recommended follow up: Return in about 1 year (around 04/25/2021) for physical or sooner if needed. Future Appointments  Date Time Provider [place]  04/26/2020  9:00 AM Nandigam, Venia Minks, MD [facility]   Lab/Order associations: fasting   ICD-10-CM   1. Preventative health care  Z00.00 CBC with Differential/Platelet    Comprehensive metabolic panel    Lipid panel  2. Essential hypertension  I10 CBC with Differential/Platelet    Comprehensive metabolic panel  3. Gastroesophageal reflux disease, unspecified whether esophagitis present  K21.9   4. Hyperlipidemia, unspecified hyperlipidemia type  E78.5 Lipid panel  5. Screening for HIV without presence of risk factors  Z11.4 HIV Antibody (routine testing w rflx)  6. Encounter for hepatitis C screening test for low risk patient  Z11.59 Hepatitis C antibody  7. Encounter for screening colonoscopy  Z12.11 CANCELED: Ambulatory referral to Gastroenterology  8. Bilateral hearing loss, unspecified hearing loss type  H91.93 Ambulatory referral to Audiology  9. Screening for prostate cancer  Z12.5 PSA    No orders of the defined types were placed in this  encounter.   Return precautions advised.   Garret Reddish, MD

## 2020-04-24 NOTE — Patient Instructions (Addendum)
Please stop by lab before you go If you have mychart- we will send your results within 3 business days of [place] receiving them.  If you do not have mychart- we will call you about results within 5 business days of [place] receiving them.  *please also note that you will see labs on mychart as soon as they post. I will later go in and write notes on them- will say "notes from Dr. Yong Channel"  We will call you within two weeks about your referral to audiology. If you do not hear within 2 weeks, give [place] a call.    Health Maintenance Due  Topic Date Due  . Hepatitis C Screening Can be done today  Never done  . COLONOSCOPY (Pts 45-37yrs Insurance coverage will need to be confirmed) Scheduled for 9am tomorrow Never done   Recommended follow up: Return in about 1 year (around 04/25/2021) for physical or sooner if needed. - if not making continued progress (continuing exercise and efforts for healthy eating and mild weight loss)

## 2020-04-25 ENCOUNTER — Ambulatory Visit (INDEPENDENT_AMBULATORY_CARE_PROVIDER_SITE_OTHER): Payer: 59 | Admitting: Family Medicine

## 2020-04-25 ENCOUNTER — Other Ambulatory Visit: Payer: Self-pay

## 2020-04-25 ENCOUNTER — Encounter: Payer: Self-pay | Admitting: Family Medicine

## 2020-04-25 VITALS — BP 120/88 | HR 76 | Temp 98.2°F | Ht 72.0 in | Wt 251.4 lb

## 2020-04-25 DIAGNOSIS — E785 Hyperlipidemia, unspecified: Secondary | ICD-10-CM | POA: Diagnosis not present

## 2020-04-25 DIAGNOSIS — Z1159 Encounter for screening for other viral diseases: Secondary | ICD-10-CM

## 2020-04-25 DIAGNOSIS — Z114 Encounter for screening for human immunodeficiency virus [HIV]: Secondary | ICD-10-CM

## 2020-04-25 DIAGNOSIS — Z125 Encounter for screening for malignant neoplasm of prostate: Secondary | ICD-10-CM | POA: Diagnosis not present

## 2020-04-25 DIAGNOSIS — Z0000 Encounter for general adult medical examination without abnormal findings: Secondary | ICD-10-CM

## 2020-04-25 DIAGNOSIS — I10 Essential (primary) hypertension: Secondary | ICD-10-CM | POA: Diagnosis not present

## 2020-04-25 DIAGNOSIS — H9193 Unspecified hearing loss, bilateral: Secondary | ICD-10-CM

## 2020-04-25 DIAGNOSIS — K219 Gastro-esophageal reflux disease without esophagitis: Secondary | ICD-10-CM | POA: Diagnosis not present

## 2020-04-25 DIAGNOSIS — Z1211 Encounter for screening for malignant neoplasm of colon: Secondary | ICD-10-CM

## 2020-04-25 LAB — COMPREHENSIVE METABOLIC PANEL
ALT: 24 U/L (ref 0–53)
AST: 21 U/L (ref 0–37)
Albumin: 4.3 g/dL (ref 3.5–5.2)
Alkaline Phosphatase: 55 U/L (ref 39–117)
BUN: 19 mg/dL (ref 6–23)
CO2: 30 mEq/L (ref 19–32)
Calcium: 9.5 mg/dL (ref 8.4–10.5)
Chloride: 103 mEq/L (ref 96–112)
Creatinine, Ser: 1.02 mg/dL (ref 0.40–1.50)
GFR: 87.85 mL/min (ref >60.00)
Glucose, Bld: 75 mg/dL (ref 70–99)
Potassium: 4.7 mEq/L (ref 3.5–5.1)
Sodium: 137 mEq/L (ref 135–145)
Total Bilirubin: 0.7 mg/dL (ref 0.2–1.2)
Total Protein: 7.5 g/dL (ref 6.0–8.3)

## 2020-04-25 LAB — LIPID PANEL
Cholesterol: 235 mg/dL — ABNORMAL HIGH (ref 0–200)
HDL: 43.5 mg/dL (ref >39.00)
LDL Cholesterol: 163 mg/dL — ABNORMAL HIGH (ref 0–99)
NonHDL: 191.35
Total CHOL/HDL Ratio: 5
Triglycerides: 144 mg/dL (ref 0.0–149.0)
VLDL: 28.8 mg/dL (ref 0.0–40.0)

## 2020-04-25 LAB — CBC WITH DIFFERENTIAL/PLATELET
Basophils Absolute: 0 10*3/uL (ref 0.0–0.1)
Basophils Relative: 0.3 % (ref 0.0–3.0)
Eosinophils Absolute: 0.1 10*3/uL (ref 0.0–0.7)
Eosinophils Relative: 1.2 % (ref 0.0–5.0)
HCT: 43.5 % (ref 39.0–52.0)
Hemoglobin: 14.4 g/dL (ref 13.0–17.0)
Lymphocytes Relative: 28.6 % (ref 12.0–46.0)
Lymphs Abs: 2.2 10*3/uL (ref 0.7–4.0)
MCHC: 33.1 g/dL (ref 30.0–36.0)
MCV: 85.4 fl (ref 78.0–100.0)
Monocytes Absolute: 0.5 10*3/uL (ref 0.1–1.0)
Monocytes Relative: 6.3 % (ref 3.0–12.0)
Neutro Abs: 5 10*3/uL (ref 1.4–7.7)
Neutrophils Relative %: 63.6 % (ref 43.0–77.0)
Platelets: 234 10*3/uL (ref 150.0–400.0)
RBC: 5.09 Mil/uL (ref 4.22–5.81)
RDW: 14 % (ref 11.5–15.5)
WBC: 7.8 10*3/uL (ref 4.0–10.5)

## 2020-04-26 ENCOUNTER — Ambulatory Visit (AMBULATORY_SURGERY_CENTER): Payer: 59 | Admitting: Gastroenterology

## 2020-04-26 ENCOUNTER — Encounter: Payer: Self-pay | Admitting: Gastroenterology

## 2020-04-26 VITALS — BP 114/65 | HR 70 | Temp 96.6°F | Resp 13 | Ht 72.0 in | Wt 254.0 lb

## 2020-04-26 DIAGNOSIS — K5730 Diverticulosis of large intestine without perforation or abscess without bleeding: Secondary | ICD-10-CM | POA: Diagnosis not present

## 2020-04-26 DIAGNOSIS — K635 Polyp of colon: Secondary | ICD-10-CM

## 2020-04-26 DIAGNOSIS — D125 Benign neoplasm of sigmoid colon: Secondary | ICD-10-CM

## 2020-04-26 DIAGNOSIS — K648 Other hemorrhoids: Secondary | ICD-10-CM

## 2020-04-26 DIAGNOSIS — R197 Diarrhea, unspecified: Secondary | ICD-10-CM

## 2020-04-26 DIAGNOSIS — K529 Noninfective gastroenteritis and colitis, unspecified: Secondary | ICD-10-CM

## 2020-04-26 DIAGNOSIS — D124 Benign neoplasm of descending colon: Secondary | ICD-10-CM

## 2020-04-26 DIAGNOSIS — D122 Benign neoplasm of ascending colon: Secondary | ICD-10-CM

## 2020-04-26 LAB — HIV ANTIBODY (ROUTINE TESTING W REFLEX): HIV 1&2 Ab, 4th Generation: NONREACTIVE

## 2020-04-26 LAB — HEPATITIS C ANTIBODY
Hepatitis C Ab: NONREACTIVE
SIGNAL TO CUT-OFF: 0.01 (ref <1.00)

## 2020-04-26 LAB — PSA: PSA: 1.17 ng/mL (ref 0.10–4.00)

## 2020-04-26 MED ORDER — SODIUM CHLORIDE 0.9 % IV SOLN: 500.0000 mL | Freq: Once | INTRAVENOUS | Status: DC

## 2020-04-26 NOTE — Patient Instructions (Signed)
YOU HAD AN ENDOSCOPIC PROCEDURE TODAY AT [facility]:   Refer to the procedure report that was given to you for any specific questions about what was found during the examination.  If the procedure report does not answer your questions, please call your gastroenterologist to clarify.  If you requested that your care partner not be given the details of your procedure findings, then the procedure report has been included in a sealed envelope for you to review at your convenience later.  YOU SHOULD EXPECT: Some feelings of bloating in the abdomen. Passage of more gas than usual.  Walking can help get rid of the air that was put into your GI tract during the procedure and reduce the bloating. If you had a lower endoscopy (such as a colonoscopy or flexible sigmoidoscopy) you may notice spotting of blood in your stool or on the toilet paper. If you underwent a bowel prep for your procedure, you may not have a normal bowel movement for a few days.  **Handouts given on polyps, hemorrhoids and diverticulosis**  Please Note:  You might notice some irritation and congestion in your nose or some drainage.  This is from the oxygen used during your procedure.  There is no need for concern and it should clear up in a day or so.  SYMPTOMS TO REPORT IMMEDIATELY:   Following lower endoscopy (colonoscopy or flexible sigmoidoscopy):  Excessive amounts of blood in the stool  Significant tenderness or worsening of abdominal pains  Swelling of the abdomen that is new, acute  Fever of 100F or higher  For urgent or emergent issues, a gastroenterologist can be reached at any hour by calling [phone]. Do not use MyChart messaging for urgent concerns.    DIET:  We do recommend a small meal at first, but then you may proceed to your regular diet.  Drink plenty of fluids but you should avoid alcoholic beverages for 24 hours.  ACTIVITY:  You should plan to take it easy for the rest of today and you  should NOT DRIVE or use heavy machinery until tomorrow (because of the sedation medicines used during the test).    FOLLOW UP: Our staff will call the number listed on your records 48-72 hours following your procedure to check on you and address any questions or concerns that you may have regarding the information given to you following your procedure. If we do not reach you, we will leave a message.  We will attempt to reach you two times.  During this call, we will ask if you have developed any symptoms of COVID 19. If you develop any symptoms (ie: fever, flu-like symptoms, shortness of breath, cough etc.) before then, please call [phone].  If you test positive for Covid 19 in the 2 weeks post procedure, please call and report this information to [place].    If any biopsies were taken you will be contacted by phone or by letter within the next 1-3 weeks.  Please call us at [phone] if you have not heard about the biopsies in 3 weeks.    SIGNATURES/CONFIDENTIALITY: You and/or your care partner have signed paperwork which will be entered into your electronic medical record.  These signatures attest to the fact that that the information above on your After Visit Summary has been reviewed and is understood.  Full responsibility of the confidentiality of this discharge information lies with you and/or your care-partner.

## 2020-04-26 NOTE — Progress Notes (Signed)
Called to room to assist during endoscopic procedure.  Patient ID and intended procedure confirmed with present staff. Received instructions for my participation in the procedure from the performing physician.  

## 2020-04-26 NOTE — Progress Notes (Signed)
Report to PACU, RN, vss, BBS= Clear.  

## 2020-04-26 NOTE — Op Note (Signed)
[facility] Patient Name: Mark Alvarado Procedure Date: 04/26/2020 8:49 AM MRN: [id number] Endoscopist: Mauri Pole , MD Age: 45 Referring MD:  Date of Birth: [birth year] Gender: Male Account #: [id number] Procedure:                Colonoscopy Indications:              Clinically significant diarrhea of unexplained                            origin Medicines:                Monitored Anesthesia Care Procedure:                Pre-Anesthesia Assessment:                           - Prior to the procedure, a History and Physical                            was performed, and patient medications and                            allergies were reviewed. The patient's tolerance of                            previous anesthesia was also reviewed. The risks                            and benefits of the procedure and the sedation                            options and risks were discussed with the patient.                            All questions were answered, and informed consent                            was obtained. Prior Anticoagulants: The patient has                            taken no previous anticoagulant or antiplatelet                            agents. ASA Grade Assessment: II - A patient with                            mild systemic disease. After reviewing the risks                            and benefits, the patient was deemed in                            satisfactory condition to undergo the procedure.  After obtaining informed consent, the colonoscope                            was passed under direct vision. Throughout the                            procedure, the patient's blood pressure, pulse, and                            oxygen saturations were monitored continuously. The                            Olympus [id number]-[id number] (#[id number]) Colonoscope was                            introduced through the anus and advanced to the the                             cecum, identified by appendiceal orifice and                            ileocecal valve. The colonoscopy was performed                            without difficulty. The patient tolerated the                            procedure well. The quality of the bowel                            preparation was excellent. The ileocecal valve,                            appendiceal orifice, and rectum were photographed. Scope In: 9:06:01 AM Scope Out: 9:30:03 AM Scope Withdrawal Time: 0 hours 17 minutes 21 seconds  Total Procedure Duration: 0 hours 24 minutes 2 seconds  Findings:                 The perianal and digital rectal examinations were                            normal.                           Four sessile polyps were found in the sigmoid                            colon, descending colon and ascending colon. The                            polyps were 4 to 7 mm in size. These polyps were                            removed with a cold snare. Resection and retrieval  were complete.                           Normal mucosa was found in the entire colon.                            Biopsies for histology were taken with a cold                            forceps from the right colon and left colon for                            evaluation of microscopic colitis.                           Scattered small and large-mouthed diverticula were                            found in the sigmoid colon, descending colon,                            transverse colon, ascending colon and cecum.                           Non-bleeding external and internal hemorrhoids were                            found during retroflexion. The hemorrhoids were                            medium-sized.                           The exam was otherwise without abnormality. Complications:            No immediate complications. Estimated Blood Loss:     Estimated blood loss was  minimal. Impression:               - Four 4 to 7 mm polyps in the sigmoid colon, in                            the descending colon and in the ascending colon,                            removed with a cold snare. Resected and retrieved.                           - Normal mucosa in the entire examined colon.                            Biopsied.                           - Diverticulosis in the sigmoid colon, in the  descending colon, in the transverse colon, in the                            ascending colon and in the cecum.                           - Non-bleeding external and internal hemorrhoids.                           - The examination was otherwise normal. Recommendation:           - Patient has a contact number available for                            emergencies. The signs and symptoms of potential                            delayed complications were discussed with the                            patient. Return to normal activities tomorrow.                            Written discharge instructions were provided to the                            patient.                           - Resume previous diet.                           - Continue present medications.                           - Await pathology results.                           - Repeat colonoscopy in 3 - 5 years for                            surveillance based on pathology results.                           - Return to GI office at the next available                            appointment. Please call to schedule appointment. Mauri Pole, MD 04/26/2020 9:41:28 AM This report has been signed electronically.

## 2020-04-30 ENCOUNTER — Telehealth: Payer: Self-pay

## 2020-04-30 NOTE — Telephone Encounter (Signed)
LVM

## 2020-05-16 ENCOUNTER — Other Ambulatory Visit: Payer: Self-pay

## 2020-05-16 ENCOUNTER — Ambulatory Visit: Payer: 59 | Attending: Family Medicine | Admitting: Audiologist

## 2020-05-16 DIAGNOSIS — H903 Sensorineural hearing loss, bilateral: Secondary | ICD-10-CM | POA: Insufficient documentation

## 2020-05-16 NOTE — Procedures (Signed)
  Outpatient [facility] [place], [place]  [id number] [phone]  AUDIOLOGICAL  EVALUATION  NAME: Mark Alvarado     DOB:   February 12, 1976      MRN: [id number]                                                                                     DATE: 05/16/2020     REFERENT: Marin Olp, MD STATUS: Outpatient DIAGNOSIS:  Mild Sensorineural Hearing Loss  History: Stephan was seen for an audiological evaluation.  [name] is receiving a hearing evaluation due to concerns for increased difficulty hearing the TV. [name] has difficulty hearing in background noise, crowds, and when people are at a distance. This difficulty began gradually. He has noticed more difficulty hearing since people started wearing masks. No pain or pressure reported in either ear. Tinnitus present in both ears the morning after he falls asleep with headphones on. 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  Medical history negative for any risk factor for hearing loss. No other relevant case history reported.   Evaluation:   Otoscopy showed a clear view of the tympanic membranes, bilaterally  Tympanometry results were consistent with normal middle ear function, bilaterally    Audiometric testing was completed using conventional audiometry with supraural transducer. Speech Recognition Thresholds were consistent with pure tone averages. Word Recognition was excellent at conversation level. Pure tone thresholds show normal sloping to a mild sensorineural hearing loss in both ears. Test results are consistent with mild presbycusis.  QuickSIN normal with SNR of 0.5 showing average ability to hear in backgrounf noise.    Results:  The test results were reviewed with Randall Hiss. He has a very mild hearing loss isolated to the highest pitches. Issai  was provided with several copies of his audiogram that illustrate his degree of hearing loss in both ears. The  hearing loss is in the high frequencies preventing Haaris from hearing high frequency consonants such as /s/ /sh/ /f/ /t/ and /th/. These sounds help differentiate the words he  hears. Without these sounds, speech is muffled and unclear unless someone is face to face within 5 feet without a mask. Cris not a hearing aid candidate due to the mild degree of loss.    Recommendations: 1.   No further audiologic testing is needed unless future hearing concerns arise. Do recommend monitoring hearing loss with exam avery other year.    Alfonse Alpers  Audiologist, Au.D., CCC-A 05/16/2020  12:25 PM  Cc: Marin Olp, MD

## 2020-05-21 ENCOUNTER — Encounter: Payer: Self-pay | Admitting: Gastroenterology

## 2020-06-04 NOTE — Progress Notes (Signed)
   Covid-19 Vaccination Clinic  Name:  AFTON MIKELSON    MRN: [id number] DOB: [birth year]  06/04/2020  Mr. Kimbrell was observed post Covid-19 immunization for 15 minutes without incident. He was provided with Vaccine Information Sheet and instruction to access the V-Safe system.   Mr. Mcwhirter was instructed to call 911 with any severe reactions post vaccine: Marland Kitchen Difficulty breathing  . Swelling of face and throat  . A fast heartbeat  . A bad rash all over body  . Dizziness and weakness   Immunizations Administered    Name Date Dose VIS Date Route   Moderna Covid-19 Booster Vaccine 02/01/2020  8:30 AM 0.25 mL 12/14/2019 Intramuscular   Manufacturer: Moderna   Lot: [id number]   [place]: [id number]

## 2020-09-11 ENCOUNTER — Telehealth: Payer: Self-pay

## 2020-09-11 NOTE — Telephone Encounter (Signed)
Called and lm on pt wife vm tcb. The only colonoscopy on file is from 04/26/20 and a letter was mailed with results and follow up instructions, unsure of what more pt wife is wanting.

## 2020-09-11 NOTE — Telephone Encounter (Signed)
Pt's wife called in asking if Dr Yong Channel would be able to review Kendrew's colonoscopy results and go over them

## 2020-09-14 NOTE — Telephone Encounter (Signed)
Patient states that he has an appointment coming up he's unsure what his wife wanted. Was also unsuccessful with reaching the wife.

## 2020-09-17 ENCOUNTER — Ambulatory Visit: Payer: 59 | Admitting: Physician Assistant

## 2020-09-17 ENCOUNTER — Other Ambulatory Visit: Payer: Self-pay

## 2020-09-17 ENCOUNTER — Encounter: Payer: Self-pay | Admitting: Physician Assistant

## 2020-09-17 VITALS — BP 129/84 | HR 88 | Temp 98.2°F | Ht 72.0 in | Wt 258.2 lb

## 2020-09-17 DIAGNOSIS — K5909 Other constipation: Secondary | ICD-10-CM | POA: Diagnosis not present

## 2020-09-17 DIAGNOSIS — R9431 Abnormal electrocardiogram [ECG] [EKG]: Secondary | ICD-10-CM | POA: Diagnosis not present

## 2020-09-17 DIAGNOSIS — I10 Essential (primary) hypertension: Secondary | ICD-10-CM

## 2020-09-17 DIAGNOSIS — R002 Palpitations: Secondary | ICD-10-CM | POA: Diagnosis not present

## 2020-09-17 NOTE — Progress Notes (Signed)
Acute Office Visit  Subjective:    Patient ID: Mark Alvarado, male    DOB: 02-Apr-1975, 45 y.o.   MRN: [id number]  Chief Complaint  Patient presents with   Abdominal Pain   Constipation    Tried a Suppository   Palpitations    HPI Patient is in today for constipation since middle of June 2022. Sometimes 3-4 days at a time w/o a bowel movement. He was trying OTC suppositories without relief. He has been traveling for work (started this Jan 2021) and eating schedule has been very sporadic, with most meals being out. Last normal bowel movement was yesterday. Enjoys his work, denies any new stress.   Patient also complains of ?Palpitations intermittently x 2 weeks. Has been feeling a flutter usually at rest, usually a few seconds at a time. Never with exertion. About 12x/day in the last 2 weeks. Stops on its own. No chest pain. Coffee 1-2x/week. He has been having a pack of [employer]ese[employer] cup daily.  Walked last night without any symptoms or difficulty. No SOB.   BP Readings from Last 3 Encounters:  09/17/20 129/84  04/26/20 114/65  04/25/20 120/88     Past Medical History:  Diagnosis Date   Asthma    As a child   DVT (deep venous thrombosis) (HCC)    Hyperlipidemia    Hypertension     Past Surgical History:  Procedure Laterality Date   left knee arthroscopic     1994- football injury    ROTATOR CUFF REPAIR Right    march 2019   ROTATOR CUFF REPAIR Left    Partial and AC joint repair december 2020    Family History  Problem Relation Age of Onset   High Cholesterol Mother    Hypertension Mother    High Cholesterol Father    Hypertension Father    Kidney disease Father        agent orange exposure- 100% disability   Colon cancer Sister        rare and typically find in men and boys- died in 78 months.    Colon cancer Maternal Aunt    Colon cancer Maternal Grandmother    Alzheimer's disease Paternal Grandmother    Healthy Sister    Other Maternal Grandfather         accidental   Heart attack Paternal Grandfather        early 63s   Colon cancer Paternal Uncle    Pancreatic cancer Neg Hx    Esophageal cancer Neg Hx    Stomach cancer Neg Hx    Liver disease Neg Hx     Social History   Socioeconomic History   Marital status: Married    Spouse name: Not on file   Number of children: Not on file   Years of education: Not on file   Highest education level: Not on file  Occupational History   Not on file  Tobacco Use   Smoking status: Never   Smokeless tobacco: Never  Vaping Use   Vaping Use: Never used  Substance and Sexual Activity   Alcohol use: Yes    Comment: rare   Drug use: Never   Sexual activity: Yes    Partners: Female  Other Topics Concern   Not on file  Social History Narrative   Married 2008. Thomasena Edis (2014), Ellard Artis (2017). Mother in law lives with them.       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 Swallow whole.     hyoscyamine (LEVSIN SL) 0.125 MG SL tablet Dissolve 1 tablet on the tongue every 6 hours 30 minutes-1 hour before meals as needed for urgency 60 tablet 6   MULTIPLE VITAMIN PO Take 1 tablet by mouth daily.     No facility-administered  medications prior to visit.    Allergies  Allergen Reactions   Dust Mite Extract    Grass Extracts [Gramineae Pollens]    Pollen Extract     Review of Systems  Constitutional:  Negative for activity change, appetite change and unexpected weight change.  HENT:  Negative for congestion.   Eyes:  Negative for visual disturbance.  Respiratory:  Negative for apnea and shortness of breath.   Cardiovascular:  Positive for palpitations. Negative for chest pain.  Gastrointestinal:  Positive for constipation. Negative for abdominal pain and blood in stool.  Endocrine: Negative for polydipsia, polyphagia and polyuria.  Genitourinary:  Negative for difficulty urinating.  Musculoskeletal:  Negative for arthralgias.  Skin:  Negative for rash.  Neurological:  Negative for seizures, weakness and headaches.  Psychiatric/Behavioral:  Negative for sleep disturbance and suicidal ideas.       Objective:    Physical Exam Vitals and nursing note reviewed.  Constitutional:      General: He is not in acute distress.    Appearance: Normal appearance. He is not toxic-appearing.  HENT:     Head: Normocephalic and atraumatic.     Right Ear: Tympanic membrane, ear canal and external ear normal.     Left Ear: Tympanic membrane, ear canal and external ear normal.     Nose: Nose normal.     Mouth/Throat:     Mouth: Mucous membranes are moist.     Pharynx: Oropharynx is clear.  Eyes:     Extraocular Movements: Extraocular movements intact.     Conjunctiva/sclera: Conjunctivae normal.     Pupils: Pupils are equal, round, and reactive to light.  Cardiovascular:     Rate and Rhythm: Normal rate and regular rhythm.     Pulses: Normal pulses.     Heart sounds: Normal heart sounds.  Pulmonary:     Effort: Pulmonary effort is normal.     Breath sounds: Normal breath sounds.  Abdominal:     General: Abdomen is flat. Bowel sounds are normal.     Palpations: Abdomen is soft.     Tenderness: There is no  abdominal tenderness.  Musculoskeletal:        General: Normal range of motion.     Cervical back: Normal range of motion and neck supple.  Skin:    General: Skin is warm and dry.  Neurological:  General: No focal deficit present.     Mental Status: He is alert and oriented to person, place, and time.  Psychiatric:        Mood and Affect: Mood normal.        Behavior: Behavior normal.    BP 129/84   Pulse 88   Temp 98.2 F (36.8 C)   Ht 6' (1.829 m)   Wt 258 lb 4 oz (117.1 kg)   SpO2 98%   BMI 35.02 kg/m  Wt Readings from Last 3 Encounters:  09/17/20 258 lb 4 oz (117.1 kg)  04/26/20 254 lb (115.2 kg)  04/25/20 251 lb 6.4 oz (114 kg)    Health Maintenance Due  Topic Date Due   Pneumococcal Vaccine 89-30 Years old (1 - PCV) Never done   COVID-19 Vaccine (4 - Booster for Moderna series) 05/01/2020    There are no preventive care reminders to display for this patient.   No results found for: TSH Lab Results  Component Value Date   WBC 7.8 04/25/2020   HGB 14.4 04/25/2020   HCT 43.5 04/25/2020   MCV 85.4 04/25/2020   PLT 234.0 04/25/2020   Lab Results  Component Value Date   NA 137 04/25/2020   K 4.7 04/25/2020   CO2 30 04/25/2020   GLUCOSE 75 04/25/2020   BUN 19 04/25/2020   CREATININE 1.02 04/25/2020   BILITOT 0.7 04/25/2020   ALKPHOS 55 04/25/2020   AST 21 04/25/2020   ALT 24 04/25/2020   PROT 7.5 04/25/2020   ALBUMIN 4.3 04/25/2020   CALCIUM 9.5 04/25/2020   ANIONGAP 9 05/23/2017   GFR 87.85 04/25/2020   Lab Results  Component Value Date   CHOL 235 (H) 04/25/2020   Lab Results  Component Value Date   HDL 43.50 04/25/2020   Lab Results  Component Value Date   LDLCALC 163 (H) 04/25/2020   Lab Results  Component Value Date   TRIG 144.0 04/25/2020   Lab Results  Component Value Date   CHOLHDL 5 04/25/2020   No results found for: HGBA1C     Assessment & Plan:   Problem List Items Addressed This Visit       Cardiovascular and  Mediastinum   Essential hypertension - Primary   Relevant Medications   aspirin EC 81 MG tablet   Other Relevant Orders   EKG 12-Lead (Completed)   Other Visit Diagnoses     Intermittent palpitations       Relevant Orders   EKG 12-Lead (Completed)   Other constipation       Abnormal ECG           1. Other constipation Worse probably secondary to stress of travel and he admits to not using the bathroom in public.  Advised him to try to add a fiber supplement daily.  To try to eat a well-balanced diet, drink 80 to 100 ounces of water a day, keep active, and try to keep her regular schedule.  He may need to consider MiraLAX if he is still having issues.  2. Essential hypertension Blood pressure is at goal without medication.  He continues to monitor this.  3. Intermittent palpitations 4. Abnormal ECG I discussed his concern and EKG reading with Dr. Yong Channel, we will refer to cardiology at this point.  Patient did tell me that he had a cardiac stress test sometime in 2012 possibly, but he does not have those records available for [place].  He is going to try to look  at home for those.  His EKG was sinus rhythm with negative T waves present.  Unsure if this is baseline for him or not as we do not have a prior EKG to compare to at this time.  He understands and agrees that if he ever has sudden chest pain or pressure and shortness of breath or other acute concerning symptoms, he will go straight to the emergency department.   Jovie Swanner M Oletha Tolson, PA-C

## 2020-09-17 NOTE — Patient Instructions (Signed)
Add fiber supplement daily. Drink 80-100 ounces daily. Keep active. Try to keep a regular eating and exercise schedule. Use the bathroom when you feel the urge.  Please send any cardiac records you may have to our office. Dr. Yong Channel and I are going to send you to cardiology.  Try to eliminate caffeine and see if symptoms improve. If sudden chest pain or pressure, shortness of breath, weakness, etc, please go to the ED.

## 2020-10-11 ENCOUNTER — Ambulatory Visit (INDEPENDENT_AMBULATORY_CARE_PROVIDER_SITE_OTHER): Payer: 59

## 2020-10-11 ENCOUNTER — Encounter: Payer: Self-pay | Admitting: Cardiology

## 2020-10-11 ENCOUNTER — Ambulatory Visit: Payer: 59 | Admitting: Cardiology

## 2020-10-11 ENCOUNTER — Other Ambulatory Visit: Payer: Self-pay

## 2020-10-11 VITALS — BP 130/72 | HR 88 | Ht 72.0 in | Wt 261.0 lb

## 2020-10-11 DIAGNOSIS — R002 Palpitations: Secondary | ICD-10-CM

## 2020-10-11 DIAGNOSIS — R9431 Abnormal electrocardiogram [ECG] [EKG]: Secondary | ICD-10-CM | POA: Diagnosis not present

## 2020-10-11 NOTE — Progress Notes (Unsigned)
Enrolled patient for a 14 day Zio XT  monitor to be mailed to patients home  °

## 2020-10-11 NOTE — Progress Notes (Signed)
Cardiology Office Note   Date:  10/11/2020   ID:  TRYGG HOPF, DOB November 05, 1975, MRN [id number]  PCP:  Marin Olp, MD  Cardiologist:   None Referring:  Marin Olp, MD  Chief Complaint  Patient presents with   Palpitations       History of Present Illness: Mark Alvarado is a 45 y.o. male who is referred by Marin Olp, MD presents for evaluation of palpitations.  He said he had a stress test years ago because of some palpitations.  He thinks this was around 2011 in [place].  He thinks that was normal.  He has been an Geographical information systems officer.  He still exercises doing some treadmill and weights.  He does not have any chest pressure, neck or arm discomfort.  He does not have any new shortness of breath, PND or orthopnea.  However, he has not abnormal EKG was lateral T wave inversions without old EKG for comparison.  He does have hypertension but he does not think its been completely out of control and he is not currently on any medications for it.  He does have palpitations which was his presenting complaint.  He feels his heart skipping occasionally.  It is fleeting.  It is associated with some discomfort which is 2 out of 10.  It happens at rest.  He does not notice with activity and cannot bring it on with any activities.  He did cut out his caffeine and he is noticing some improvement.  He is not describing sustained tacky arrhythmias.  He is not having any presyncope or syncope.  He said no weight gain or edema.   Past Medical History:  Diagnosis Date   Asthma    As a child   DVT (deep venous thrombosis) ([place])    Hyperlipidemia    Hypertension     Past Surgical History:  Procedure Laterality Date   left knee arthroscopic     1994- football injury    ROTATOR CUFF REPAIR Right    march 2019   ROTATOR CUFF REPAIR Left    Partial and AC joint repair december 2020     Current Outpatient Medications  Medication Sig Dispense Refill    aspirin EC 81 MG tablet Take 81 mg by mouth daily. Swallow whole.     hyoscyamine (LEVSIN SL) 0.125 MG SL tablet Dissolve 1 tablet on the tongue every 6 hours 30 minutes-1 hour before meals as needed for urgency 60 tablet 6   MULTIPLE VITAMIN PO Take 1 tablet by mouth daily.     No current facility-administered medications for this visit.    Allergies:   Dust mite extract, Grass extracts [gramineae pollens], and Pollen extract    Social History:  The patient  reports that he has never smoked. He has never used smokeless tobacco. He reports current alcohol use. He reports that he does not use drugs.   Family History:  The patient's family history includes Alzheimer's disease in his paternal grandmother; Colon cancer in his maternal aunt, maternal grandmother, paternal uncle, and sister; Healthy in his sister; Heart attack in his paternal grandfather; High Cholesterol in his father and mother; Hypertension in his father and mother; Kidney disease in his father; Other in his maternal grandfather.    ROS:  Please see the history of present illness.   Otherwise, review of systems are positive for none.   All other systems are reviewed and negative.    PHYSICAL  EXAM: VS:  BP 130/72   Pulse 88   Ht 6' (1.829 m)   Wt 261 lb (118.4 kg)   SpO2 97%   BMI 35.40 kg/m  , BMI Body mass index is 35.4 kg/m. GENERAL:  Well appearing HEENT:  Pupils equal round and reactive, fundi not visualized, oral mucosa unremarkable NECK:  No jugular venous distention, waveform within normal limits, carotid upstroke brisk and symmetric, no bruits, no thyromegaly LYMPHATICS:  No cervical, inguinal adenopathy LUNGS:  Clear to auscultation bilaterally BACK:  No CVA tenderness CHEST:  Unremarkable HEART:  PMI not displaced or sustained,S1 and S2 within normal limits, no S3, no S4, no clicks, no rubs, no murmurs ABD:  Flat, positive bowel sounds normal in frequency in pitch, no bruits, no rebound, no guarding, no  midline pulsatile mass, no hepatomegaly, no splenomegaly EXT:  2 plus pulses throughout, no edema, no cyanosis no clubbing SKIN:  No rashes no nodules NEURO:  Cranial nerves II through XII grossly intact, motor grossly intact throughout PSYCH:  Cognitively intact, oriented to person place and time    EKG:  EKG is not ordered today. The ekg ordered 09/17/2020 demonstrates sinus rhythm, rate 82, axis within normal limits, intervals within normal limits, lateral T wave inversions without old EKGs for comparison.   Recent Labs: 04/25/2020: ALT 24; BUN 19; Creatinine, Ser 1.02; Hemoglobin 14.4; Platelets 234.0; Potassium 4.7; Sodium 137    Lipid Panel    Component Value Date/Time   CHOL 235 (H) 04/25/2020 1230   TRIG 144.0 04/25/2020 1230   HDL 43.50 04/25/2020 1230   CHOLHDL 5 04/25/2020 1230   VLDL 28.8 04/25/2020 1230   LDLCALC 163 (H) 04/25/2020 1230      Wt Readings from Last 3 Encounters:  10/11/20 261 lb (118.4 kg)  09/17/20 258 lb 4 oz (117.1 kg)  04/26/20 254 lb (115.2 kg)      Other studies Reviewed: Additional studies/ records that were reviewed today include: Labs. Review of the above records demonstrates:  Please see elsewhere in the note.     ASSESSMENT AND PLAN:  PALPITATIONS: I am going to have him wear a 2-week event monitor.  He should also get a TSH.  I do see that electrolytes were recently normal.  Further management will be based on this.  ABNORMAL EKG: I am wondering if he has some LVH and hypertension more than he does.  He will keep a blood pressure diary and get an echocardiogram.  HTN: As above  DYSLIPIDEMIA: His LDL was 163.  I am going to order a coronary calcium score to evaluate goals of therapy.  He is going to think about a plant-based Mediterranean diet.  Further management will be based on future readings as well as his calcium score.   Current medicines are reviewed at length with the patient today.  The patient does not have concerns  regarding medicines.  The following changes have been made:  no change  Labs/ tests ordered today include:   Orders Placed This Encounter  Procedures   CT CARDIAC SCORING (SELF PAY ONLY)   TSH   LONG TERM MONITOR (3-14 DAYS)   ECHOCARDIOGRAM COMPLETE      Disposition:   FU with me after the studies.   Signed, Minus Breeding, MD  10/11/2020 4:58 PM    [facility]

## 2020-10-11 NOTE — Patient Instructions (Signed)
Lab Work:  Your physician recommends that you HAVE LAB WORK TODAY  If you have labs (blood work) drawn today and your tests are completely normal, you will receive your results only by: MyChart Message (if you have MyChart) OR A paper copy in the mail If you have any lab test that is abnormal or we need to change your treatment, we will call you to review the results.   Testing/Procedures:  Your physician has requested that you have an echocardiogram. Echocardiography is a painless test that uses sound waves to create images of your heart. It provides your doctor with information about the size and shape of your heart and how well your heart's chambers and valves are working. This procedure takes approximately one hour. There are no restrictions for this procedure. [place] Monitor Instructions  Your physician has requested you wear a ZIO patch monitor for 14 days.  This is a single patch monitor. [employer] supplies one patch monitor per enrollment. Additional stickers are not available. Please do not apply patch if you will be having a Nuclear Stress Test,  Echocardiogram, Cardiac CT, MRI, or Chest Xray during the period you would be wearing the  monitor. The patch cannot be worn during these tests. You cannot remove and re-apply the  ZIO XT patch monitor.  Your ZIO patch monitor will be mailed 3 day USPS to your address on file. It may take 3-5 days  to receive your monitor after you have been enrolled.  Once you have received your monitor, please review the enclosed instructions. Your monitor  has already been registered assigning a specific monitor serial # to you.  Billing and Patient Assistance Program Information  We have supplied [employer] with any of your insurance information on file for billing purposes. [employer] offers a sliding scale Patient Assistance Program for patients that do not  have  insurance, or whose insurance does not completely cover the cost of the ZIO monitor.  You must apply for the Patient Assistance Program to qualify for this discounted rate.  To apply, please call [employer] at [phone], select option 4, select option 2, ask to apply for  Patient Assistance Program. Theodore Demark will ask your household income, and how many people  are in your household. They will quote your out-of-pocket cost based on that information.  [employer] will also be able to set up a 55-month interest-free payment plan if needed.  Applying the monitor   Shave hair from upper left chest.  Hold abrader disc by orange tab. Rub abrader in 40 strokes over the upper left chest as  indicated in your monitor instructions.  Clean area with 4 enclosed alcohol pads. Let dry.  Apply patch as indicated in monitor instructions. Patch will be placed under collarbone on left  side of chest with arrow pointing upward.  Rub patch adhesive wings for 2 minutes. Remove white label marked "1". Remove the white  label marked "2". Rub patch adhesive wings for 2 additional minutes.  While looking in a mirror, press and release button in center of patch. A small green light will  flash 3-4 times. This will be your only indicator that the monitor has been turned on.  Do not shower for the first 24 hours. You may shower after the first 24 hours.  Press the button if you feel a symptom. You will hear a small click. Record Date, Time and  Symptom in the Patient Logbook.  When you are ready to remove the patch, follow instructions on the last 2 pages of Patient  Logbook. Stick patch monitor onto the last page of Patient Logbook.  Place Patient Logbook in the blue and white box. Use locking tab on box and tape box closed  securely. The blue and white box has prepaid postage on it. Please place it in the mailbox as  soon as possible. Your physician should have your test results approximately 7 days after the   monitor has been mailed back to [facility].  Call [place] at [phone] if you have questions regarding  your ZIO XT patch monitor. Call them immediately if you see an orange light blinking on your  monitor.  If your monitor falls off in less than 4 days, contact our Monitor department at [phone].  If your monitor becomes loose or falls off after 4 days call [employer] at [phone] for  suggestions on securing your monitor    Follow-Up: At [facility], you and your health needs are our priority.  As part of our continuing mission to provide you with exceptional heart care, we have created designated Provider Care Teams.  These Care Teams include your primary Cardiologist (physician) and Advanced Practice Providers (APPs -  Physician Assistants and Nurse Practitioners) who all work together to provide you with the care you need, when you need it.  We recommend signing up for the patient portal called "MyChart".  Sign up information is provided on this After Visit Summary.  MyChart is used to connect with patients for Virtual Visits (Telemedicine).  Patients are able to view lab/test results, encounter notes, upcoming appointments, etc.  Non-urgent messages can be sent to your provider as well.   To learn more about what you can do with MyChart, go to [email or web address].    Your next appointment:    AFTER TESTING COMPLETE WITH DR [facility]   Other Instructions  [employer]   KEEP BLOOD PRESSURE LOG

## 2020-10-12 LAB — TSH: TSH: 1.22 u[IU]/mL (ref 0.450–4.500)

## 2020-10-16 DIAGNOSIS — R002 Palpitations: Secondary | ICD-10-CM | POA: Diagnosis not present

## 2020-10-19 ENCOUNTER — Ambulatory Visit (INDEPENDENT_AMBULATORY_CARE_PROVIDER_SITE_OTHER)
Admission: RE | Admit: 2020-10-19 | Discharge: 2020-10-19 | Disposition: A | Discharge status: Home / Self Care | Payer: Self-pay | Source: Ambulatory Visit | Attending: Cardiology | Admitting: Cardiology

## 2020-10-19 ENCOUNTER — Ambulatory Visit (HOSPITAL_COMMUNITY): Payer: 59 | Attending: Internal Medicine

## 2020-10-19 ENCOUNTER — Other Ambulatory Visit: Payer: Self-pay

## 2020-10-19 DIAGNOSIS — R002 Palpitations: Secondary | ICD-10-CM | POA: Insufficient documentation

## 2020-10-19 DIAGNOSIS — R9431 Abnormal electrocardiogram [ECG] [EKG]: Secondary | ICD-10-CM | POA: Diagnosis present

## 2020-10-19 LAB — ECHOCARDIOGRAM COMPLETE
Area-P 1/2: 4.15 cm2
S' Lateral: 3 cm

## 2020-10-19 MED ORDER — PERFLUTREN LIPID MICROSPHERE
1.0000 mL | INTRAVENOUS | Status: AC | PRN
Start: 2020-10-19 — End: 2020-10-19
  Administered 2020-10-19: 3 mL via INTRAVENOUS

## 2020-11-19 ENCOUNTER — Telehealth: Payer: Self-pay | Admitting: Cardiology

## 2020-11-19 NOTE — Telephone Encounter (Signed)
Left message for patient, fax number given to the patient or he can send via my chart. Asked the patient to call me regarding his monitor next week when he is back in town.

## 2020-11-19 NOTE — Telephone Encounter (Signed)
Patient returning call for monitor results. He says he is in [place] at a conference, but will be back next week. He would like to know how he needs to turn in the BP readings he filled out. He states he can fax it once he is home or turn it in to the office.

## 2020-11-26 ENCOUNTER — Ambulatory Visit: Payer: 59 | Admitting: Cardiology

## 2020-12-25 ENCOUNTER — Encounter: Payer: Self-pay | Admitting: *Deleted

## 2020-12-27 ENCOUNTER — Telehealth: Payer: Self-pay | Admitting: *Deleted

## 2020-12-27 ENCOUNTER — Other Ambulatory Visit: Payer: Self-pay | Admitting: *Deleted

## 2020-12-27 ENCOUNTER — Encounter: Payer: Self-pay | Admitting: *Deleted

## 2020-12-27 DIAGNOSIS — R9431 Abnormal electrocardiogram [ECG] [EKG]: Secondary | ICD-10-CM

## 2020-12-27 DIAGNOSIS — R002 Palpitations: Secondary | ICD-10-CM

## 2020-12-27 MED ORDER — METOPROLOL TARTRATE 100 MG PO TABS
ORAL_TABLET | ORAL | 0 refills | Status: DC
Start: 2020-12-27 — End: 2021-02-06

## 2020-12-27 NOTE — Telephone Encounter (Signed)
-----   Message from Minus Breeding, MD sent at 11/11/2020  9:47 AM EDT ----- He had runs of non sustained ventriclar tachycardia. Given these, his slight coronary calcium and the abnormal EKG that does not seem to be related to hypertensive heart disease with his normal echo I would like to exclude obstructive coronary disease with a coronary CTA.  Please call and schedule.  Call Mark Alvarado with the results and send results to Marin Olp, MD

## 2020-12-27 NOTE — Telephone Encounter (Signed)
Spoke with pt, he has not received any of my previous phone calls. He is aware of the results. Order placed for cardiac cta. Instructions sent to patient via my chart.

## 2021-01-07 ENCOUNTER — Telehealth (HOSPITAL_COMMUNITY): Payer: Self-pay | Admitting: *Deleted

## 2021-01-07 NOTE — Telephone Encounter (Signed)
Attempted to call patient regarding upcoming cardiac CT appointment. °Left message on voicemail with name and callback number ° °Delaney Perona RN Navigator Cardiac Imaging °[facility] °[phone] Office °[phone] Cell ° °

## 2021-01-08 ENCOUNTER — Telehealth (HOSPITAL_COMMUNITY): Payer: Self-pay | Admitting: *Deleted

## 2021-01-08 LAB — BASIC METABOLIC PANEL
BUN/Creatinine Ratio: 18 (ref 9–20)
BUN: 15 mg/dL (ref 6–24)
CO2: 25 mmol/L (ref 20–29)
Calcium: 9.7 mg/dL (ref 8.7–10.2)
Chloride: 103 mmol/L (ref 96–106)
Creatinine, Ser: 0.83 mg/dL (ref 0.76–1.27)
Glucose: 82 mg/dL (ref 70–99)
Potassium: 5 mmol/L (ref 3.5–5.2)
Sodium: 141 mmol/L (ref 134–144)
eGFR: 109 mL/min/{1.73_m2} (ref >59)

## 2021-01-08 NOTE — Telephone Encounter (Signed)
Reaching out to patient to offer assistance regarding upcoming cardiac imaging study; pt verbalizes understanding of appt date/time, parking situation and where to check in, pre-test NPO status and medications ordered, and verified current allergies; name and call back number provided for further questions should they arise  Gordy Clement RN Navigator Cardiac [facility] [phone] office [phone] cell  Patient to take 100mg  metoprolol tartrate and is aware to arrive at 8:30am for his 9am scan.

## 2021-01-09 ENCOUNTER — Encounter (HOSPITAL_COMMUNITY): Payer: Self-pay

## 2021-01-09 ENCOUNTER — Other Ambulatory Visit: Payer: Self-pay

## 2021-01-09 ENCOUNTER — Ambulatory Visit (HOSPITAL_COMMUNITY)
Admission: RE | Admit: 2021-01-09 | Discharge: 2021-01-09 | Disposition: A | Discharge status: Home / Self Care | Payer: 59 | Source: Ambulatory Visit | Attending: Cardiology | Admitting: Cardiology

## 2021-01-09 DIAGNOSIS — R9431 Abnormal electrocardiogram [ECG] [EKG]: Secondary | ICD-10-CM | POA: Diagnosis not present

## 2021-01-09 DIAGNOSIS — R002 Palpitations: Secondary | ICD-10-CM | POA: Insufficient documentation

## 2021-01-09 MED ORDER — NITROGLYCERIN 0.4 MG SL SUBL
0.8000 mg | SUBLINGUAL_TABLET | Freq: Once | SUBLINGUAL | Status: AC
  Administered 2021-01-09: 0.8 mg via SUBLINGUAL

## 2021-01-09 MED ORDER — IOHEXOL 350 MG/ML SOLN
95.0000 mL | Freq: Once | INTRAVENOUS | Status: AC | PRN
  Administered 2021-01-09: 95 mL via INTRAVENOUS

## 2021-01-09 MED ORDER — NITROGLYCERIN 0.4 MG SL SUBL
SUBLINGUAL_TABLET | SUBLINGUAL | Status: AC
  Filled 2021-01-09: qty 2

## 2021-01-21 ENCOUNTER — Encounter: Payer: Self-pay | Admitting: *Deleted

## 2021-02-04 DIAGNOSIS — R9431 Abnormal electrocardiogram [ECG] [EKG]: Secondary | ICD-10-CM | POA: Insufficient documentation

## 2021-02-04 DIAGNOSIS — I4729 Other ventricular tachycardia: Secondary | ICD-10-CM | POA: Insufficient documentation

## 2021-02-04 NOTE — Progress Notes (Signed)
Cardiology Office Note   Date:  02/06/2021   ID:  Mark Alvarado, DOB Jul 03, 1975, MRN [id number]  PCP:  Marin Olp, MD  Cardiologist:   None Referring:  Marin Olp, MD  Chief Complaint  Patient presents with   Palpitations        History of Present Illness: Mark Alvarado is a 45 y.o. male who is referred by Marin Olp, MD presents for evaluation of palpitations.  He said he had a stress test years ago because of some palpitations.  He thinks this was around 2011 in [place].  I saw him earlier this year for evaluation of an abnormal EKG.    Echo demonstrated no abnormalities.  CT angiogram demonstrated RCA 25 - 49% stenosis.   He had palpitations and had runs of NSVT.  The longest run was 10 beats.     Since I last saw him he went to a plant-based diet.  He says he feels much better.  He is exercising routinely doing the treadmill.  He does push-ups.  He stopped drinking caffeine.  He is not having the palpitations that he was having.  The patient denies any new symptoms such as chest discomfort, neck or arm discomfort. There has been no new shortness of breath, PND or orthopnea. There have been no reported palpitations, presyncope or syncope.    Past Medical History:  Diagnosis Date   Asthma    As a child   DVT (deep venous thrombosis) ([place])    Hyperlipidemia    Hypertension     Past Surgical History:  Procedure Laterality Date   left knee arthroscopic     1994- football injury    ROTATOR CUFF REPAIR Right    march 2019   ROTATOR CUFF REPAIR Left    Partial and AC joint repair december 2020     Current Outpatient Medications  Medication Sig Dispense Refill   MULTIPLE VITAMIN PO Take 1 tablet by mouth daily.     No current facility-administered medications for this visit.    Allergies:   Dust mite extract, Grass extracts [gramineae pollens], and Pollen extract    ROS:  Please see the history of present illness.   Otherwise, review of systems  are positive for none.   All other systems are reviewed and negative.    PHYSICAL EXAM: VS:  BP 114/80 (BP Location: Left Arm, Patient Position: Sitting, Cuff Size: Normal)   Pulse 71   Ht 6' (1.829 m)   Wt 257 lb (116.6 kg)   BMI 34.86 kg/m  , BMI Body mass index is 34.86 kg/m. GENERAL:  Well appearing NECK:  No jugular venous distention, waveform within normal limits, carotid upstroke brisk and symmetric, no bruits, no thyromegaly LUNGS:  Clear to auscultation bilaterally CHEST:  Unremarkable HEART:  PMI not displaced or sustained,S1 and S2 within normal limits, no S3, no S4, no clicks, no rubs, no murmurs ABD:  Flat, positive bowel sounds normal in frequency in pitch, no bruits, no rebound, no guarding, no midline pulsatile mass, no hepatomegaly, no splenomegaly EXT:  2 plus pulses throughout, no edema, no cyanosis no clubbing    EKG:  EKG  ordered today. The ekg ordered 02/06/21 demonstrates sinus rhythm, rate 71, axis within normal limits, intervals within normal limits, lateral T wave inversions without old EKGs for comparison.   Recent Labs: 04/25/2020: ALT 24; Hemoglobin 14.4; Platelets 234.0 10/11/2020: TSH 1.220 01/07/2021: BUN 15; Creatinine, Ser 0.83; Potassium 5.0; Sodium  141    Lipid Panel    Component Value Date/Time   CHOL 235 (H) 04/25/2020 1230   TRIG 144.0 04/25/2020 1230   HDL 43.50 04/25/2020 1230   CHOLHDL 5 04/25/2020 1230   VLDL 28.8 04/25/2020 1230   LDLCALC 163 (H) 04/25/2020 1230      Wt Readings from Last 3 Encounters:  02/06/21 257 lb (116.6 kg)  10/11/20 261 lb (118.4 kg)  09/17/20 258 lb 4 oz (117.1 kg)      Other studies Reviewed: Additional studies/ records that were reviewed today include: None. Review of the above records demonstrates:  Please see elsewhere in the note.     ASSESSMENT AND PLAN:  NSVT: He is not having any new symptoms.  He stopped caffeine and his palpitations went away.  No change in therapy.   ABNORMAL EKG:  From suspecting repolarization.  The echo was unremarkable.  Other studies as above.  No further work-up.  HTN:   His blood pressure is controlled.  He will continue the meds as listed.   CAD: He had nonobstructive disease.  We are pursuing aggressive risk reduction.  No further testing is indicated.  DYSLIPIDEMIA: His LDL was 163 but I will check it today and see how much progress he has made with a plant-based diet.  He will also start red yeast rice.  I will repeat a lipid profile about four months after he starts this.    Current medicines are reviewed at length with the patient today.  The patient does not have concerns regarding medicines.  The following changes have been made:  As above  Labs/ tests ordered today include:   Orders Placed This Encounter  Procedures   Lipid panel   Lipid panel   EKG 12-Lead    Disposition:   FU with me in 12 months.    Signed, Minus Breeding, MD  02/06/2021 9:59 AM    [facility]

## 2021-02-06 ENCOUNTER — Other Ambulatory Visit: Payer: Self-pay

## 2021-02-06 ENCOUNTER — Encounter: Payer: Self-pay | Admitting: Cardiology

## 2021-02-06 ENCOUNTER — Ambulatory Visit: Payer: 59 | Admitting: Cardiology

## 2021-02-06 VITALS — BP 114/80 | HR 71 | Ht 72.0 in | Wt 257.0 lb

## 2021-02-06 DIAGNOSIS — E785 Hyperlipidemia, unspecified: Secondary | ICD-10-CM

## 2021-02-06 DIAGNOSIS — R9431 Abnormal electrocardiogram [ECG] [EKG]: Secondary | ICD-10-CM

## 2021-02-06 DIAGNOSIS — I4729 Other ventricular tachycardia: Secondary | ICD-10-CM | POA: Diagnosis not present

## 2021-02-06 DIAGNOSIS — I10 Essential (primary) hypertension: Secondary | ICD-10-CM

## 2021-02-06 LAB — LIPID PANEL
Chol/HDL Ratio: 5.4 ratio — ABNORMAL HIGH (ref 0.0–5.0)
Cholesterol, Total: 204 mg/dL — ABNORMAL HIGH (ref 100–199)
HDL: 38 mg/dL — ABNORMAL LOW (ref >39)
LDL Chol Calc (NIH): 140 mg/dL — ABNORMAL HIGH (ref 0–99)
Triglycerides: 145 mg/dL (ref 0–149)
VLDL Cholesterol Cal: 26 mg/dL (ref 5–40)

## 2021-02-06 NOTE — Patient Instructions (Addendum)
Medication Instructions:  Your Physician recommend you continue on your current medication as directed.    *If you need a refill on your cardiac medications before your next appointment, please call your pharmacy*   Lab Work: today and in 4 months Fasting lipids If you have labs (blood work) drawn today and your tests are completely normal, you will receive your results only by: [place] (if you have MyChart) OR A paper copy in the mail If you have any lab test that is abnormal or we need to change your treatment, we will call you to review the results.   Follow-Up: At [facility], you and your health needs are our priority.  As part of our continuing mission to provide you with exceptional heart care, we have created designated Provider Care Teams.  These Care Teams include your primary Cardiologist (physician) and Advanced Practice Providers (APPs -  Physician Assistants and Nurse Practitioners) who all work together to provide you with the care you need, when you need it.  We recommend signing up for the patient portal called "MyChart".  Sign up information is provided on this After Visit Summary.  MyChart is used to connect with patients for Virtual Visits (Telemedicine).  Patients are able to view lab/test results, encounter notes, upcoming appointments, etc.  Non-urgent messages can be sent to your provider as well.   To learn more about what you can do with MyChart, go to [email or web address].    Your next appointment:   12 month(s)  The format for your next appointment:   In Person  Provider:   Dr. Vita Barley, MD

## 2021-02-08 ENCOUNTER — Other Ambulatory Visit: Payer: Self-pay | Admitting: *Deleted

## 2021-02-08 DIAGNOSIS — E785 Hyperlipidemia, unspecified: Secondary | ICD-10-CM

## 2021-02-13 ENCOUNTER — Ambulatory Visit: Payer: 59 | Admitting: Family Medicine

## 2021-02-13 ENCOUNTER — Other Ambulatory Visit: Payer: Self-pay

## 2021-02-13 ENCOUNTER — Ambulatory Visit (INDEPENDENT_AMBULATORY_CARE_PROVIDER_SITE_OTHER): Payer: 59

## 2021-02-13 VITALS — BP 136/84 | HR 96 | Ht 72.0 in | Wt 253.6 lb

## 2021-02-13 DIAGNOSIS — K5901 Slow transit constipation: Secondary | ICD-10-CM

## 2021-02-13 DIAGNOSIS — M545 Low back pain, unspecified: Secondary | ICD-10-CM

## 2021-02-13 NOTE — Progress Notes (Signed)
° °  I, Peterson Lombard, LAT, ATC acting as a scribe for Lynne Leader, MD.  Subjective:    CC: Abdomin & low back pain  HPI: Pt is a 45 y/o male c/o low back pain Sunday-Monday, really worsening on Tuesdays. Pt noticed pain after doing a lot of pushups. Pt had started doing pushup pyramids since the beginning of December. Pt c/o stomach pain for about 2 weeks and felt that he was constipated, no BM for several consecutive days.   Pt locates pain to bilat side of the low back. The pain in the lower portion of his abdomen has resolved.  Radiating pain: no LE numbness/tingling: no LE weakness: no Aggravates: deep breaths, Valsalva maneuver Treatments tried: heat, ice, icy hot patch, Excedrin,   Pertinent review of Systems: No fevers or chills  Relevant historical information: Family history of colon cancer.  He had a colonoscopy March of this year   Objective:    Vitals:   02/13/21 1534  BP: 136/84  Pulse: 96  SpO2: 97%   General: Well Developed, well nourished, and in no acute distress.  Abdomen soft nontender. MSK: L-spine: Normal-appearing Nontender midline. Tender palpation lumbar paraspinal musculature. Normal lumbar motion some pain with extension. Lower extremity strength reflexes and sensation are equal and normal throughout.  Lab and Radiology Results  X-ray images L-spine obtained today personally and independently interpreted Mild facet DJD L5-S1 otherwise normal-appearing.  No acute fractures. Await formal radiology review    Impression and Recommendations:    Assessment and Plan: 45 y.o. male with low back pain occurring after significantly increasing the amount of push-ups that he does.  Fundamentally I think he has a low back strain due to excessive extension.  He is a great candidate for physical therapy.  Plan to refer to PT primarily for core strengthening.  We discussed medications.  Plan for muscle relaxers.  Recommend heating pad and TENS  unit.. Recheck back in about 6 weeks.  Abdominal discomfort due to constipation.  Discussed constipation management strategies.  Reviewed colonoscopy with patient as well as the surgical pathology from the colonoscopy biopsies.  Recommend MiraLAX. F/u with PCP if needed.   PDMP not reviewed this encounter. Orders Placed This Encounter  Procedures   DG Lumbar Spine 2-3 Views    Standing Status:   Future    Number of Occurrences:   1    Standing Expiration Date:   02/13/2022    Order Specific Question:   Reason for Exam (SYMPTOM  OR DIAGNOSIS REQUIRED)    Answer:   low back pain    Order Specific Question:   Preferred imaging location?    Answer:   [facility]   Ambulatory referral to Physical Therapy    Referral Priority:   Routine    Referral Type:   Physical Medicine    Referral Reason:   Specialty Services Required    Requested Specialty:   Physical Therapy    Number of Visits Requested:   1   No orders of the defined types were placed in this encounter.   Discussed warning signs or symptoms. Please see discharge instructions. Patient expresses understanding.   The above documentation has been reviewed and is accurate and complete Lynne Leader, M.D.

## 2021-02-13 NOTE — Patient Instructions (Addendum)
Thank you for coming in today.   I've referred you to Physical Therapy.  Let us know if you don't hear from them in one week.   Please get an Xray today before you leave   Recheck back in 6 weeks

## 2021-02-14 ENCOUNTER — Encounter: Payer: Self-pay | Admitting: Family Medicine

## 2021-02-14 MED ORDER — TIZANIDINE HCL 4 MG PO TABS: 4.0000 mg | ORAL_TABLET | Freq: Four times a day (QID) | ORAL | 1 refills | Status: DC | PRN

## 2021-02-15 NOTE — Progress Notes (Signed)
Lumbar spine x-ray shows some arthritis changes at L3-L4.

## 2021-03-04 ENCOUNTER — Other Ambulatory Visit: Payer: Self-pay

## 2021-03-04 ENCOUNTER — Ambulatory Visit: Payer: 59 | Admitting: Physical Therapy

## 2021-03-04 DIAGNOSIS — M6281 Muscle weakness (generalized): Secondary | ICD-10-CM

## 2021-03-04 DIAGNOSIS — M545 Low back pain, unspecified: Secondary | ICD-10-CM

## 2021-03-04 NOTE — Therapy (Signed)
OUTPATIENT PHYSICAL THERAPY THORACOLUMBAR EVALUATION   Patient Name: Mark Alvarado MRN: [id number] DOB:Apr 09, 1975, 46 y.o., male Today's Date: 03/04/21      PT End of Session - 03/04/21 0853     Visit Number 1    Number of Visits 8    Date for PT Re-Evaluation 04/29/21    Authorization Type [employer] VL [id number]    Authorization - Visit Number 1    Authorization - Number of Visits --    Progress Note Due on Visit 10    PT Start Time [phone]    PT Stop Time 0932    PT Time Calculation (min) 41 min    Activity Tolerance Patient tolerated treatment well    Behavior During Therapy WFL for tasks assessed/performed             Past Medical History:  Diagnosis Date   Asthma    As a child   DVT (deep venous thrombosis) ([place])    Hyperlipidemia    Hypertension    Past Surgical History:  Procedure Laterality Date   left knee arthroscopic     1994- football injury    ROTATOR CUFF REPAIR Right    march 2019   ROTATOR CUFF REPAIR Left    Partial and [facility] joint repair december 2020   Patient Active Problem List   Diagnosis Date Noted   NSVT (nonsustained ventricular tachycardia) 02/04/2021   Nonspecific abnormal electrocardiogram (ECG) (EKG) 02/04/2021   Diverticulosis of colon without hemorrhage 03/29/2020   Family hx of colon cancer 03/29/2020   Allergic rhinitis 12/28/2019   Childhood asthma 12/28/2019   GERD (gastroesophageal reflux disease) 12/28/2019   Heart murmur 12/28/2019   Hyperlipidemia 12/28/2019   Essential hypertension 12/28/2019   History of DVT (deep vein thrombosis) 07/23/2017    PCP: Marin Olp, MD  REFERRING PROVIDER: Gregor Hams, MD  REFERRING DIAG: M54.50 (ICD-10-CM) - Acute bilateral low back pain without sciatica  THERAPY DIAG:  Acute midline low back pain without sciatica  Muscle weakness (generalized)  ONSET DATE: December 2022  SUBJECTIVE:                                                                                                                                                                                            SUBJECTIVE STATEMENT: States that he has had a bump on his back that has been bothering him and he has had some pus/fluid come out and it keeps happening, was trying to get MD appt to check today. States he has been doing pyramid pushups and when he got to the 20 rep week he had pain on  both sides of the spine but he pushed through the pain. States his pain is better now but still tight on either side of his back. When it first happened he got a TENs machine and a massager and the massager has helped. Reports he has had a history of back pain but it hasn't felt like this, or continued like this has.  States he walks and does push ups but doesn't do anything else in regards to working out. 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   PERTINENT HISTORY:  Hx of DVT, asthma, Bilat RCR 2019/2020, left knee scope 1994  PAIN:  Are you having pain? Yes VAS scale: 2/10 Pain location: back Pain orientation: Bilateral  PAIN TYPE: tight Pain description: intermittent  Aggravating factors: pushups, getting up from sitting for long periods of time Relieving factors: rest  PRECAUTIONS: None  WEIGHT BEARING RESTRICTIONS No  FALLS:  Has patient fallen in last 6 months? No, Number of falls: 0   PLOF: Independent  PATIENT GOALS: to have less pain , exercise without pain.    OBJECTIVE:   DIAGNOSTIC FINDINGS:  Xray 02/13/21 IMPRESSION: 1. No acute findings. 2. Minimal spondylosis of the lumbar spine with minimal disc disease at the L3-4 level.   Functional Observation:  Pushups: collapses into excessive lordosis/at thoracolumbar junction - improved with core cues    SCREENING FOR RED FLAGS: Bowel or bladder incontinence: No Spinal tumors: No Cauda equina syndrome: No Compression fracture: No Abdominal aneurysm: No  COGNITION:  Overall cognitive status: Within functional limits for tasks assessed   Skin: Pt with  small round spot at central, mid thoracic spine, does appear to have small amount of clear/white fluid in it, no signs of redness or infection. Pt has been self draining, discussed seeing MD soon if it does not clear up, discussed signs of infection .     POSTURE:  Rounded shoulders, sacral sitter, reduced lumbar lordosis in sitting and standing  PALPATION: Minor tenderness with palpation along bilateral paraspinals. Pain primarily along thoracolumbar junction   LUMBARAROM/PROM  Lumbar ROM: mild limitation for flexion, mild limitation and pain with extension,  Hips: WFL    LE MMT: Hips: 4+/5     LUMBAR SPECIAL TESTS:  Neg SLR     TODAY'S TREATMENT   03/04/21: Therapeutic Exercise:  Aerobic: Supine: modified pushups on knees x5; SKTC x1 30" holds Bilat; LTR x5 10" holds bilat, pelvic tilts in both directions x5 5" holds   Seated: hamstring stretch x3 bilat 10" holds  Standing:  Prone: Childs pose L/C/R x2 each 10 second holds Neuromuscular Re-education: Manual Therapy: Therapeutic Activity: Self Care: Trigger Point Dry Needling:  Modalities:     PATIENT EDUCATION:  PATIENT EDUCATION:  Education details: on current presentation, on HEP and POC for back pain,Discussed  wound care and how to monitor and care for spot on back and what to do if it worsens,  Person educated: Patient Education method: Explanation, Demonstration, and Handouts Education comprehension: verbalized understanding    HOME EXERCISE PROGRAM: [id number]  ASSESSMENT:  CLINICAL IMPRESSION: Patient is a 46 y.o. male who was seen today for physical therapy evaluation and treatment for low back pain. Objective impairments include pain, decreased activity tolerance, decreased mobility, decreased ROM, decreased strength, increased muscle spasms, improper body mechanics, and pain. These impairments are limiting patient from community activity, occupation, and working out, standing and walking after sitting  . Personal factors including Fitness are also affecting patient's functional outcome. Patient will benefit from skilled PT  to address above impairments and improve overall function.   03/04/21: Patient presents to therapy with complaints of lumbar pain that is most consistent with lumbar muscle strain. He has tightness and tenderness in bil paraspinals with palpation and activity, likely secondary to poor core strength and lumbar mobility deficits while performing a pyramid scheme style pushup program. Reviewed optimal back posture and core activation with push ups today. He has decreased ability for full functional activities at this time due to pain. He has lack of effective HEP for his dx, and will benefit from education on posture, core strength, home ergonomics for managing pain. Patient educated in current condition and POC regarding PT moving forward. Patient would greatly benefit from skilled PT to improve deficits and pain.   REHAB POTENTIAL: Good  CLINICAL DECISION MAKING: Stable/uncomplicated  EVALUATION COMPLEXITY: Low   GOALS: Goals reviewed with patient? Yes  SHORT TERM GOALS:  STG Name Target Date Goal status  1 Patient will be independent with initial HEP   03/18/21 INITIAL  2 Pt to demo optimal posture with back when performing push ups.  03/18/21 INITIAL         LONG TERM GOALS:   LTG Name Target Date Goal status  1 Patient will be independent with final HEP  04/29/21 INITIAL  2 Pt to report decreased pain in  to 0-2/10 in bil thoracic/lumbar spine   04/29/21 INITIAL  3 Pt to demo improved core strength/stability, to have only minimal postural changes with performing strengthening and stability exercises, to improve back pain.   04/29/21 INITIAL  4 Pt to demonstrate optimal mechanics and ability for bend, lift, squat without pain, to improve ability for IADLs.   04/29/21 INITIAL  5           PLAN: PT FREQUENCY: 1x/week  PT DURATION: 8 weeks  PLANNED INTERVENTIONS:  Therapeutic exercises, Therapeutic activity, Neuro Muscular re-education, Balance training, Gait training, Patient/Family education, Joint mobilization, Orthotic/Fit training, Aquatic Therapy, Dry Needling, Electrical stimulation, Spinal mobilization, Cryotherapy, Moist heat, Taping, Traction, and Manual therapy  PLAN FOR NEXT SESSION: core strengthening, lumbar/thoracic mobility   Lyndee Hensen, PT, DPT 8:28 AM  03/05/21

## 2021-03-05 ENCOUNTER — Encounter: Payer: Self-pay | Admitting: Physical Therapy

## 2021-03-11 ENCOUNTER — Encounter: Payer: 59 | Admitting: Physical Therapy

## 2021-03-11 ENCOUNTER — Other Ambulatory Visit: Payer: Self-pay

## 2021-03-18 ENCOUNTER — Encounter: Payer: 59 | Admitting: Physical Therapy

## 2021-03-22 ENCOUNTER — Encounter: Payer: 59 | Admitting: Physical Therapy

## 2021-03-28 ENCOUNTER — Encounter: Payer: 59 | Admitting: Physical Therapy

## 2021-03-29 ENCOUNTER — Ambulatory Visit: Payer: 59 | Admitting: Family Medicine

## 2021-04-26 ENCOUNTER — Encounter: Payer: Self-pay | Admitting: Family Medicine

## 2021-04-26 ENCOUNTER — Ambulatory Visit (INDEPENDENT_AMBULATORY_CARE_PROVIDER_SITE_OTHER): Payer: 59 | Admitting: Family Medicine

## 2021-04-26 ENCOUNTER — Other Ambulatory Visit: Payer: Self-pay

## 2021-04-26 VITALS — BP 118/78 | HR 76 | Temp 97.9°F | Ht 72.0 in | Wt 259.0 lb

## 2021-04-26 DIAGNOSIS — E785 Hyperlipidemia, unspecified: Secondary | ICD-10-CM

## 2021-04-26 DIAGNOSIS — Z125 Encounter for screening for malignant neoplasm of prostate: Secondary | ICD-10-CM

## 2021-04-26 DIAGNOSIS — Z0000 Encounter for general adult medical examination without abnormal findings: Secondary | ICD-10-CM | POA: Diagnosis not present

## 2021-04-26 DIAGNOSIS — I10 Essential (primary) hypertension: Secondary | ICD-10-CM

## 2021-04-26 LAB — CBC WITH DIFFERENTIAL/PLATELET
Basophils Absolute: 0 10*3/uL (ref 0.0–0.1)
Basophils Relative: 0.3 % (ref 0.0–3.0)
Eosinophils Absolute: 0.1 10*3/uL (ref 0.0–0.7)
Eosinophils Relative: 1.7 % (ref 0.0–5.0)
HCT: 40.3 % (ref 39.0–52.0)
Hemoglobin: 13.4 g/dL (ref 13.0–17.0)
Lymphocytes Relative: 34.4 % (ref 12.0–46.0)
Lymphs Abs: 2.6 10*3/uL (ref 0.7–4.0)
MCHC: 33.1 g/dL (ref 30.0–36.0)
MCV: 85.3 fl (ref 78.0–100.0)
Monocytes Absolute: 0.5 10*3/uL (ref 0.1–1.0)
Monocytes Relative: 6.3 % (ref 3.0–12.0)
Neutro Abs: 4.3 10*3/uL (ref 1.4–7.7)
Neutrophils Relative %: 57.3 % (ref 43.0–77.0)
Platelets: 237 10*3/uL (ref 150.0–400.0)
RBC: 4.73 Mil/uL (ref 4.22–5.81)
RDW: 14 % (ref 11.5–15.5)
WBC: 7.5 10*3/uL (ref 4.0–10.5)

## 2021-04-26 LAB — COMPREHENSIVE METABOLIC PANEL
ALT: 29 U/L (ref 0–53)
AST: 26 U/L (ref 0–37)
Albumin: 4.4 g/dL (ref 3.5–5.2)
Alkaline Phosphatase: 46 U/L (ref 39–117)
BUN: 11 mg/dL (ref 6–23)
CO2: 29 mEq/L (ref 19–32)
Calcium: 9.3 mg/dL (ref 8.4–10.5)
Chloride: 102 mEq/L (ref 96–112)
Creatinine, Ser: 0.92 mg/dL (ref 0.40–1.50)
GFR: 98.74 mL/min (ref >60.00)
Glucose, Bld: 80 mg/dL (ref 70–99)
Potassium: 4.6 mEq/L (ref 3.5–5.1)
Sodium: 137 mEq/L (ref 135–145)
Total Bilirubin: 0.7 mg/dL (ref 0.2–1.2)
Total Protein: 7.3 g/dL (ref 6.0–8.3)

## 2021-04-26 LAB — PSA: PSA: 1.19 ng/mL (ref 0.10–4.00)

## 2021-04-26 NOTE — Progress Notes (Signed)
? ?Phone: [phone] ? ?  ?Subjective:  ?Patient presents today for their annual physical. Chief complaint-noted.  ? ?See problem oriented charting- ?ROS- full  review of systems was completed and negative  per full ROS sheet reviewed by patient ? ?The following were reviewed and entered/updated in epic: ?Past Medical History:  ?Diagnosis Date  ? Asthma   ? As a child  ? DVT (deep venous thrombosis) ([place])   ? Hyperlipidemia   ? Hypertension   ? ?Patient Active Problem List  ? Diagnosis Date Noted  ? Hyperlipidemia 12/28/2019  ?  Priority: Medium   ? Essential hypertension 12/28/2019  ?  Priority: Medium   ? History of DVT (deep vein thrombosis) 07/23/2017  ?  Priority: Medium   ? Allergic rhinitis 12/28/2019  ?  Priority: Low  ? Childhood asthma 12/28/2019  ?  Priority: Low  ? GERD (gastroesophageal reflux disease) 12/28/2019  ?  Priority: Low  ? Heart murmur 12/28/2019  ?  Priority: Low  ? NSVT (nonsustained ventricular tachycardia) 02/04/2021  ? Nonspecific abnormal electrocardiogram (ECG) (EKG) 02/04/2021  ? Diverticulosis of colon without hemorrhage 03/29/2020  ? Family hx of colon cancer 03/29/2020  ? ?Past Surgical History:  ?Procedure Laterality Date  ? left knee arthroscopic    ? 1994- football injury   ? ROTATOR CUFF REPAIR Right   ? march 2019  ? ROTATOR CUFF REPAIR Left   ? Partial and AC joint repair december 2020  ? ? ?Family History  ?Problem Relation Age of Onset  ? High Cholesterol Mother   ? Hypertension Mother   ? High Cholesterol Father   ? Hypertension Father   ? Kidney disease Father   ?     agent orange exposure- 100% disability  ? Colon cancer Sister   ?     rare and typically find in men and boys- died in 70 months.   ? Colon cancer Maternal Aunt   ? Colon cancer Maternal Grandmother   ? Alzheimer's disease Paternal Grandmother   ? Healthy Sister   ? Other Maternal Grandfather   ?     accidental  ? Heart attack Paternal Grandfather   ?     early 78s  ? Colon cancer Paternal Uncle   ?  Pancreatic cancer Neg Hx   ? Esophageal cancer Neg Hx   ? Stomach cancer Neg Hx   ? Liver disease Neg Hx   ? ? ?Medications- reviewed and updated ?Current Outpatient Medications  ?Medication Sig Dispense Refill  ? MULTIPLE VITAMIN PO Take 1 tablet by mouth daily.    ? ?No current facility-administered medications for this visit.  ? ? ?Allergies-reviewed and updated ?Allergies  ?Allergen Reactions  ? Dust Mite Extract   ? Grass Extracts [Gramineae Pollens]   ? Pollen Extract   ? ? ?Social History  ? ?Social History Narrative  ? Married 2008. Thomasena Edis (2014), Ellard Artis (2017). Mother in law lives with them.   ?   ? 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 [employer] under[employer]. 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 Was Doctor, general practice at [employer]  ?   ? Hobbies: time with family and sons, walking in mornings, date night once a week with wife, church  ? ?  ?Objective:  ?BP 118/78   Pulse 76   Temp 97.9 ?F (  36.6 ?C)   Ht 6' (1.829 m)   Wt 259 lb (117.5 kg)   SpO2 100%   BMI 35.13 kg/m?  ?Gen: NAD, resting comfortably ?HEENT: Mucous membranes are moist. Oropharynx normal ?Neck: no thyromegaly ?CV: RRR no murmurs rubs or gallops ?Lungs: CTAB no crackles, wheeze, rhonchi ?Abdomen: soft/nontender/nondistended/normal bowel sounds. No rebound or guarding.  ?Ext: no edema ?Skin: warm, dry ?Neuro: grossly normal, moves all extremities, PERRLA ?   ?Assessment and Plan:  ?46 y.o. male presenting for annual physical.  ?Health Maintenance counseling: ?1. Anticipatory guidance: Patient counseled regarding regular dental exams -q6 months but may change due to benefits, eye exams -upcoming on monday,  avoiding smoking and second hand smoke , limiting alcohol to 2 beverages per day-extremely rare glass of wine, no illicit drugs.   ?2. Risk factor reduction:   Advised patient of need for regular exercise and diet rich and fruits and vegetables to reduce risk of heart attack and stroke.  ?Exercise-  still doing 35 mins to an hour a day walking. Plus doing some lifting/strength training in hotel gyms- enough to maintain ?Diet/weight management- Down from peak of 275 but up form 251 last year cpe- long term goal at 220- wants to decrease portion sizes- has already improved diet with more fruits/veggies- no meats at this point- takes MV centrum for men.  ?Wt Readings from Last 3 Encounters:  ?04/26/21 259 lb (117.5 kg)  ?02/13/21 253 lb 9.6 oz (115 kg)  ?02/06/21 257 lb (116.6 kg)  ?3. Immunizations/screenings/ancillary studies- up to date  ?Immunization History  ?Administered Date(s) Administered  ? Influenza,inj,Quad PF,6+ Mos 12/28/2019  ? Moderna SARS-COV2 Booster Vaccination 02/01/2020  ? Moderna Sars-Covid-2 Vaccination 04/28/2019, 05/31/2019  ? Pension scheme manager 4yrs & up 12/12/2020  ? Tdap 09/25/2014  ?4. Prostate cancer screening- will screen early due to being african [place] male.   ?Lab Results  ?Component Value Date  ? PSA 1.17 04/25/2020  ? 5. Colon cancer screening -04/26/20 with 5 year repeat planned I believe with Dr. Toni Amend with precancerous polyps.  colon cancer in sister but was told rare - they did not increase his risk substantially.  ?6. Skin cancer screening/prevention-- lower risk due to melanin content. advised regular sunscreen use. Denies worrisome, changing, or new skin lesions.  ?7. Testicular cancer screening- advised monthly self exams ?8. STD screening-patient opts out as only with wife ?9. Smoking associated screening- never smoker ? ?Status of chronic or acute concerns  ? ?#palpitations- he had NSVT and palpitations. Watched gamechangers and came off meat in July 2022. Also cut out caffeine and no more palpitatoins.  ? ?#hypertension ?S: medication: none ?BP Readings from Last 3 Encounters:  ?04/26/21 118/78   ?02/13/21 136/84  ?02/06/21 114/80  ?A/P: well controlled with exercise/weight loss ?  ?# GERD- much improved GI issues overall ?-vinegar helped if needed in past- no reflux on plant based diet ?- perhaps some mild IBS- hyoscyamine as helped. This resolved with plant based diet ?  ?#nonobstructive CAD- coronary calcium score is 5 - 82nd percentile during Ct coronary morphology on 01/09/21 ?#hyperlipidemia ?S: Medication: red yeast rice ?- crestor in past but had bad abdominal pain with crunches and sounded like had high Ck  ?Lab Results  ?Component Value Date  ? CHOL 204 (H) 02/06/2021  ? HDL 38 (L) 02/06/2021  ? LDLCALC 140 (H) 02/06/2021  ? TRIG 145 02/06/2021  ? CHOLHDL 5.4 (H) 02/06/2021  ? A/P: lipids have been high but he is  now on red yeast rice and plans to recheck with cardiology in April.  ?  ?#history provoked DVT after rotator cuff repair 2019- was in upper arm- no evidence of recurrence ? ?# hearing loss- was checked 2022 by audiology and recommended 2 year repeat- refer back [facility] ? ?Recommended follow up: No follow-ups on file. ? ?Lab/Order associations: fasting ?  ICD-10-CM   ?1. Preventative health care  Z00.00   ?  ?2. Essential hypertension  I10   ?  ?3. Hyperlipidemia, unspecified hyperlipidemia type  E78.5   ?  ? ? ?No orders of the defined types were placed in this encounter. ? ? ?I,Jada Bradford,acting as a scribe for Garret Reddish, MD.,have documented all relevant documentation on the behalf of Garret Reddish, MD,as directed by  Garret Reddish, MD while in the presence of Garret Reddish, MD. ? ?I, Garret Reddish, MD, have reviewed all documentation for this visit. The documentation on 04/26/21 for the exam, diagnosis, procedures, and orders are all accurate and complete. ? ?Return precautions advised.  ? ?Garret Reddish, MD ? ? ? ?

## 2021-04-26 NOTE — Patient Instructions (Addendum)
Please stop by lab before you go ?If you have mychart- we will send your results within 3 business days of [place] receiving them.  ?If you do not have mychart- we will call you about results within 5 business days of [place] receiving them.  ?*please also note that you will see labs on mychart as soon as they post. I will later go in and write notes on them- will say "notes from Dr. Yong Channel"  ? ?Would still love to have you out at [facility]!  ? ?Recommended follow up: Return in about 1 year (around 04/27/2022) for physical or sooner if needed. ?

## 2021-05-27 LAB — LIPID PANEL
Chol/HDL Ratio: 5.1 ratio — ABNORMAL HIGH (ref 0.0–5.0)
Cholesterol, Total: 190 mg/dL (ref 100–199)
HDL: 37 mg/dL — ABNORMAL LOW (ref >39)
LDL Chol Calc (NIH): 130 mg/dL — ABNORMAL HIGH (ref 0–99)
Triglycerides: 129 mg/dL (ref 0–149)
VLDL Cholesterol Cal: 23 mg/dL (ref 5–40)

## 2021-05-28 ENCOUNTER — Encounter: Payer: Self-pay | Admitting: Cardiology

## 2021-05-28 ENCOUNTER — Other Ambulatory Visit: Payer: Self-pay | Admitting: *Deleted

## 2021-05-28 DIAGNOSIS — E785 Hyperlipidemia, unspecified: Secondary | ICD-10-CM

## 2021-05-28 MED ORDER — ATORVASTATIN CALCIUM 20 MG PO TABS: 40.0000 mg | ORAL_TABLET | Freq: Every day | ORAL | 3 refills | Status: DC

## 2021-06-03 ENCOUNTER — Encounter (HOSPITAL_BASED_OUTPATIENT_CLINIC_OR_DEPARTMENT_OTHER): Payer: Self-pay

## 2021-06-03 ENCOUNTER — Encounter: Payer: Self-pay | Admitting: Physician Assistant

## 2021-06-03 ENCOUNTER — Ambulatory Visit: Payer: 59 | Admitting: Physician Assistant

## 2021-06-03 ENCOUNTER — Emergency Department (HOSPITAL_BASED_OUTPATIENT_CLINIC_OR_DEPARTMENT_OTHER)
Admission: EM | Admit: 2021-06-03 | Discharge: 2021-06-04 | Disposition: A | Discharge status: Home / Self Care | Payer: 59 | Attending: Emergency Medicine | Admitting: Emergency Medicine

## 2021-06-03 ENCOUNTER — Other Ambulatory Visit: Payer: Self-pay | Admitting: Physician Assistant

## 2021-06-03 ENCOUNTER — Ambulatory Visit (INDEPENDENT_AMBULATORY_CARE_PROVIDER_SITE_OTHER)
Admission: RE | Admit: 2021-06-03 | Discharge: 2021-06-03 | Disposition: A | Discharge status: Home / Self Care | Payer: 59 | Source: Ambulatory Visit | Attending: Physician Assistant | Admitting: Physician Assistant

## 2021-06-03 ENCOUNTER — Telehealth: Payer: Self-pay | Admitting: Family Medicine

## 2021-06-03 ENCOUNTER — Other Ambulatory Visit: Payer: Self-pay

## 2021-06-03 VITALS — BP 120/80 | HR 81 | Temp 97.3°F | Ht 72.0 in | Wt 249.5 lb

## 2021-06-03 DIAGNOSIS — E785 Hyperlipidemia, unspecified: Secondary | ICD-10-CM

## 2021-06-03 DIAGNOSIS — K5900 Constipation, unspecified: Secondary | ICD-10-CM | POA: Diagnosis not present

## 2021-06-03 DIAGNOSIS — K5730 Diverticulosis of large intestine without perforation or abscess without bleeding: Secondary | ICD-10-CM

## 2021-06-03 DIAGNOSIS — R1032 Left lower quadrant pain: Secondary | ICD-10-CM | POA: Diagnosis present

## 2021-06-03 DIAGNOSIS — K5792 Diverticulitis of intestine, part unspecified, without perforation or abscess without bleeding: Secondary | ICD-10-CM | POA: Insufficient documentation

## 2021-06-03 LAB — URINALYSIS, ROUTINE W REFLEX MICROSCOPIC
Bilirubin Urine: NEGATIVE
Glucose, UA: NEGATIVE mg/dL
Hgb urine dipstick: NEGATIVE
Nitrite: NEGATIVE
Protein, ur: NEGATIVE mg/dL
Specific Gravity, Urine: 1.017 (ref 1.005–1.030)
pH: 5.5 (ref 5.0–8.0)

## 2021-06-03 LAB — CBC WITH DIFFERENTIAL/PLATELET
Basophils Absolute: 0.1 10*3/uL (ref 0.0–0.1)
Basophils Relative: 0.5 % (ref 0.0–3.0)
Eosinophils Absolute: 0.1 10*3/uL (ref 0.0–0.7)
Eosinophils Relative: 1.1 % (ref 0.0–5.0)
HCT: 42.8 % (ref 39.0–52.0)
Hemoglobin: 14 g/dL (ref 13.0–17.0)
Lymphocytes Relative: 21.9 % (ref 12.0–46.0)
Lymphs Abs: 2.4 10*3/uL (ref 0.7–4.0)
MCHC: 32.6 g/dL (ref 30.0–36.0)
MCV: 86.3 fl (ref 78.0–100.0)
Monocytes Absolute: 0.7 10*3/uL (ref 0.1–1.0)
Monocytes Relative: 6.3 % (ref 3.0–12.0)
Neutro Abs: 7.6 10*3/uL (ref 1.4–7.7)
Neutrophils Relative %: 70.2 % (ref 43.0–77.0)
Platelets: 258 10*3/uL (ref 150.0–400.0)
RBC: 4.96 Mil/uL (ref 4.22–5.81)
RDW: 13.5 % (ref 11.5–15.5)
WBC: 10.8 10*3/uL — ABNORMAL HIGH (ref 4.0–10.5)

## 2021-06-03 LAB — CBC
HCT: 43 % (ref 39.0–52.0)
Hemoglobin: 13.7 g/dL (ref 13.0–17.0)
MCH: 27.6 pg (ref 26.0–34.0)
MCHC: 31.9 g/dL (ref 30.0–36.0)
MCV: 86.5 fL (ref 80.0–100.0)
Platelets: 280 10*3/uL (ref 150–400)
RBC: 4.97 MIL/uL (ref 4.22–5.81)
RDW: 13.2 % (ref 11.5–15.5)
WBC: 11.5 10*3/uL — ABNORMAL HIGH (ref 4.0–10.5)
nRBC: 0 % (ref 0.0–0.2)

## 2021-06-03 MED ORDER — CIPROFLOXACIN HCL 500 MG PO TABS: 500.0000 mg | ORAL_TABLET | Freq: Two times a day (BID) | ORAL | 0 refills | Status: AC

## 2021-06-03 MED ORDER — METRONIDAZOLE 500 MG PO TABS: 500.0000 mg | ORAL_TABLET | Freq: Three times a day (TID) | ORAL | 0 refills | Status: AC

## 2021-06-03 NOTE — Telephone Encounter (Signed)
I personally spoke with the patient about his x-ray and lab results.  He is not going to fly out to [place], but he is in work meetings from home.  Discussed with patient given his history of diverticulosis on colonoscopy last year, we are both comfortable with going ahead and treating for acute diverticulitis today.  I sent metronidazole and ciprofloxacin to his pharmacy.  He is going to stick to a liquid diet for the next 2 days.  Strict ER precautions discussed with patient.  He is going to call back tomorrow with an update for me. ?

## 2021-06-03 NOTE — Progress Notes (Signed)
? ?Subjective:  ? ? Patient ID: Mark Alvarado, male    DOB: 10-Feb-1976, 46 y.o.   MRN: [id number] ? ?Chief Complaint  ?Patient presents with  ? Abdominal Pain  ?  Pt c/o lower abdominal pain x 1 week and constipation, last time moved bowels good was last Monday. He moved bowels a tiny bit yesterday of diarrhea.He took Mag Citrated 5 ml's on Sat and another 5 ml's yesterday. Denies nausea  ? Constipation  ? ? ?Abdominal Pain ?Associated symptoms include constipation.  ?Constipation ?Associated symptoms include abdominal pain.  ?Patient is in today for abdominal pain and constipation x 6 days. ?No changes in diet or exercise. ?Has had some issues with constipation in the past, but never this long before.  ? ?Started on Lipitor 20 mg twice daily the same day that symptoms started.  ? ?Some leaking stool two days ago.  ? ?Trying heating pad. Started Miralax Tues and Wed last week.  ?1/2 bottle mag citrate on Sat and Sunday with lots of gas, but no passage of stool.  ? ?Colonoscopy UTD 04/26/20 - showed 4 polyps and diverticulosis present.  ? ?No fever or chills. No blood in stool. No weakness. No N/V.  ? ?Flies out to [place] this evening for work.  ? ?Past Medical History:  ?Diagnosis Date  ? Asthma   ? As a child  ? DVT (deep venous thrombosis) ([place])   ? Hyperlipidemia   ? Hypertension   ? ? ?Past Surgical History:  ?Procedure Laterality Date  ? left knee arthroscopic    ? 1994- football injury   ? ROTATOR CUFF REPAIR Right   ? march 2019  ? ROTATOR CUFF REPAIR Left   ? Partial and AC joint repair december 2020  ? ? ?Family History  ?Problem Relation Age of Onset  ? High Cholesterol Mother   ? Hypertension Mother   ? High Cholesterol Father   ? Hypertension Father   ? Kidney disease Father   ?     agent orange exposure- 100% disability  ? Colon cancer Sister   ?     rare and typically find in men and boys- died in 76 months.   ? Colon cancer Maternal Aunt   ? Colon cancer Maternal Grandmother   ? Alzheimer's disease  Paternal Grandmother   ? Healthy Sister   ? Other Maternal Grandfather   ?     accidental  ? Heart attack Paternal Grandfather   ?     early 68s  ? Colon cancer Paternal Uncle   ? Pancreatic cancer Neg Hx   ? Esophageal cancer Neg Hx   ? Stomach cancer Neg Hx   ? Liver disease Neg Hx   ? ? ?Social History  ? ?Tobacco Use  ? Smoking status: Never  ? Smokeless tobacco: Never  ?Vaping Use  ? Vaping Use: Never used  ?Substance Use Topics  ? Alcohol use: Yes  ?  Comment: rare  ? Drug use: Never  ?  ? ?Allergies  ?Allergen Reactions  ? Dust Mite Extract   ? Grass Extracts [Gramineae Pollens]   ? Pollen Extract   ? ? ?Review of Systems  ?Gastrointestinal:  Positive for abdominal pain and constipation.  ?NEGATIVE UNLESS OTHERWISE INDICATED IN HPI ? ? ?   ?Objective:  ?  ? ?BP 120/80 (BP Location: Left Arm, Patient Position: Sitting, Cuff Size: Large)   Pulse 81   Temp (!) 97.3 ?F (36.3 ?C) (Temporal)   Ht 6' (  1.829 m)   Wt 249 lb 8 oz (113.2 kg)   SpO2 96%   BMI 33.84 kg/m?  ? ?Wt Readings from Last 3 Encounters:  ?06/03/21 249 lb 8 oz (113.2 kg)  ?04/26/21 259 lb (117.5 kg)  ?02/13/21 253 lb 9.6 oz (115 kg)  ? ? ?BP Readings from Last 3 Encounters:  ?06/03/21 120/80  ?04/26/21 118/78  ?02/13/21 136/84  ?  ? ?Physical Exam ?Constitutional:   ?   Appearance: He is well-developed. He is obese.  ?Abdominal:  ?   General: Abdomen is flat. Bowel sounds are normal. There is no distension.  ?   Tenderness: There is abdominal tenderness in the suprapubic area and left lower quadrant. There is no right CVA tenderness, left CVA tenderness, guarding or rebound. Negative signs include Murphy's sign and McBurney's sign.  ?Skin: ?   General: Skin is warm.  ?   Coloration: Skin is not pale.  ?   Findings: No rash.  ?Neurological:  ?   Mental Status: He is alert.  ?Psychiatric:     ?   Mood and Affect: Mood normal.  ? ? ?   ?Assessment & Plan:  ? ?Problem List Items Addressed This Visit   ? ?  ? Other  ? Hyperlipidemia  ?  Diverticulosis of colon without hemorrhage  ? Relevant Orders  ? CBC with Differential/Platelet  ? DG Abd 2 Views  ? ?Other Visit Diagnoses   ? ? Left lower quadrant abdominal pain    -  Primary  ? Relevant Orders  ? CBC with Differential/Platelet  ? DG Abd 2 Views  ? Constipation, unspecified constipation type      ? Relevant Orders  ? CBC with Differential/Platelet  ? DG Abd 2 Views  ? ?  ? ? ?Plan: ?No red flags on exam today.  Most likely patient is experiencing severe constipation and GI distress secondary to sudden start of Lipitor 20 mg BID.  Looking back on his lab results with Dr. Percival Spanish, it's noted for him to start on only 20 mg daily. Rx was filled to take BID, which he has been doing.  ? ?Because he has history of diverticulosis noted on colonoscopy and he is tender over his left lower quadrant today, I will get a stat CBC to make sure his white blood cell count is not elevated at this time which could indicate diverticulitis. ? ?I will also get abdominal x-ray at [facility] before he flies out to [facility] ? ?ER precautions discussed with him. ? ?Will start on bowel cleanout with Miralax as directed in AVS.  ? ?Stop your lipitor this week. Start back on Saturday and do 20 mg every other day for a few weeks, then daily, to see how you tolerate it.  ? ?Send MyChart with update.  ? ?This note was prepared with assistance of Dragon voice recognition software. Occasional wrong-word or sound-a-like substitutions may have occurred due to the inherent limitations of voice recognition software. ? ?Time Spent: ?31 minutes of total time was spent on the date of the encounter performing the following actions: chart review prior to seeing the patient, obtaining history, performing a medically necessary exam, counseling on the treatment plan, placing orders, and documenting in our EHR.   ? ?Smrithi Pigford M Ellenor Wisniewski, PA-C ?

## 2021-06-03 NOTE — Telephone Encounter (Signed)
Pt is calling in regards to his lab results. Please return call. ?

## 2021-06-03 NOTE — ED Triage Notes (Signed)
Abdominal pain since last Tuesday a week ago. States his symptoms started after he started Lipitor.  He also states his urine stream is not as strong as it normally is.   ?

## 2021-06-03 NOTE — Patient Instructions (Addendum)
CBC today to make sure white blood cell count isn't elevated indicating possible diverticulitis infection. ? ?XRAY abdomen at [facility]. ? ?64 oz Gatorade / juice /water with 8 capfuls of Miralax, drink 8 oz every 30 minutes. ? ?ED if acutely severe pain, fever, or blood in stool develops.  ? ?Stop your lipitor this week. Start back on Saturday and do 20 mg every other day for a few weeks, then daily, to see how you tolerate it.  ?

## 2021-06-04 ENCOUNTER — Other Ambulatory Visit: Payer: 59

## 2021-06-04 ENCOUNTER — Emergency Department (HOSPITAL_BASED_OUTPATIENT_CLINIC_OR_DEPARTMENT_OTHER): Payer: 59

## 2021-06-04 ENCOUNTER — Encounter (HOSPITAL_BASED_OUTPATIENT_CLINIC_OR_DEPARTMENT_OTHER): Payer: Self-pay | Admitting: Radiology

## 2021-06-04 LAB — COMPREHENSIVE METABOLIC PANEL
ALT: 17 U/L (ref 0–44)
AST: 21 U/L (ref 15–41)
Albumin: 4.7 g/dL (ref 3.5–5.0)
Alkaline Phosphatase: 42 U/L (ref 38–126)
Anion gap: 10 (ref 5–15)
BUN: 13 mg/dL (ref 6–20)
CO2: 27 mmol/L (ref 22–32)
Calcium: 9.8 mg/dL (ref 8.9–10.3)
Chloride: 101 mmol/L (ref 98–111)
Creatinine, Ser: 0.93 mg/dL (ref 0.61–1.24)
GFR, Estimated: >60 mL/min (ref >60)
Glucose, Bld: 91 mg/dL (ref 70–99)
Potassium: 4.4 mmol/L (ref 3.5–5.1)
Sodium: 138 mmol/L (ref 135–145)
Total Bilirubin: 0.8 mg/dL (ref 0.3–1.2)
Total Protein: 8.3 g/dL — ABNORMAL HIGH (ref 6.5–8.1)

## 2021-06-04 LAB — LIPASE, BLOOD: Lipase: 12 U/L (ref 11–51)

## 2021-06-04 MED ORDER — MORPHINE SULFATE (PF) 4 MG/ML IV SOLN
4.0000 mg | Freq: Once | INTRAVENOUS | Status: AC
  Administered 2021-06-04: 4 mg via INTRAVENOUS
  Filled 2021-06-04: qty 1

## 2021-06-04 MED ORDER — ONDANSETRON 4 MG PO TBDP: 4.0000 mg | ORAL_TABLET | Freq: Three times a day (TID) | ORAL | 0 refills | Status: AC | PRN

## 2021-06-04 MED ORDER — OXYCODONE HCL 5 MG PO TABS: 2.5000 mg | ORAL_TABLET | Freq: Four times a day (QID) | ORAL | 0 refills | Status: AC | PRN

## 2021-06-04 MED ORDER — ACETAMINOPHEN 500 MG PO TABS
1000.0000 mg | ORAL_TABLET | Freq: Once | ORAL | Status: AC
  Administered 2021-06-04: 1000 mg via ORAL
  Filled 2021-06-04: qty 2

## 2021-06-04 MED ORDER — IOHEXOL 300 MG/ML  SOLN
100.0000 mL | Freq: Once | INTRAMUSCULAR | Status: AC | PRN
  Administered 2021-06-04: 100 mL via INTRAVENOUS

## 2021-06-04 NOTE — ED Provider Notes (Signed)
?[facility] ?Provider Note ? ?CSN: [id number] ?Arrival date & time: 06/03/21 2242 ? ?Chief Complaint(s) ?Abdominal Pain ? ?HPI ?Mark Alvarado is a 46 y.o. male   ? ? ?Abdominal Pain ?Pain location:  LLQ ?Pain quality: aching   ?Pain radiates to:  Does not radiate ?Pain severity:  Moderate ?Onset quality:  Gradual ?Duration:  6 days ?Timing:  Constant ?Progression:  Worsening ?Chronicity:  New ?Context: not previous surgeries, not recent illness, not sick contacts, not suspicious food intake and not trauma   ?Relieved by:  Nothing ?Worsened by:  Movement ?Associated symptoms: no constipation, no diarrhea, no fatigue, no fever, no nausea and no vomiting   ? ?Seen by PCP today and empirically treated for diverticulitis.  Pain continues to worsen prompting the visit now. ? ?Past Medical History ?Past Medical History:  ?Diagnosis Date  ? Asthma   ? As a child  ? DVT (deep venous thrombosis) ([place])   ? Hyperlipidemia   ? Hypertension   ? ?Patient Active Problem List  ? Diagnosis Date Noted  ? NSVT (nonsustained ventricular tachycardia) ([place]) 02/04/2021  ? Nonspecific abnormal electrocardiogram (ECG) (EKG) 02/04/2021  ? Diverticulosis of colon without hemorrhage 03/29/2020  ? Family hx of colon cancer 03/29/2020  ? Allergic rhinitis 12/28/2019  ? Childhood asthma 12/28/2019  ? GERD (gastroesophageal reflux disease) 12/28/2019  ? Heart murmur 12/28/2019  ? Hyperlipidemia 12/28/2019  ? Essential hypertension 12/28/2019  ? History of DVT (deep vein thrombosis) 07/23/2017  ? ?Home Medication(s) ?Prior to Admission medications   ?Medication Sig Start Date End Date Taking? Authorizing Provider  ?ondansetron (ZOFRAN-ODT) 4 MG disintegrating tablet Take 1 tablet (4 mg total) by mouth every 8 (eight) hours as needed for up to 3 days for nausea or vomiting. 06/04/21 06/07/21 Yes Maydelin Deming, Grayce Sessions, MD  ?oxyCODONE (ROXICODONE) 5 MG immediate release tablet Take 0.5-1 tablets (2.5-5 mg total) by mouth every  6 (six) hours as needed for up to 3 days for severe pain. 06/04/21 06/07/21 Yes Chizaram Latino, Grayce Sessions, MD  ?atorvastatin (LIPITOR) 20 MG tablet Take 2 tablets (40 mg total) by mouth daily. 05/28/21 08/26/21  Minus Breeding, MD  ?ciprofloxacin (CIPRO) 500 MG tablet Take 1 tablet (500 mg total) by mouth 2 (two) times daily for 10 days. 06/03/21 06/13/21  Allwardt, Randa Evens, PA-C  ?hyoscyamine (LEVSIN SL) 0.125 MG SL tablet Place under the tongue 2 (two) times daily. 03/10/21   [provider]  ?metroNIDAZOLE (FLAGYL) 500 MG tablet Take 1 tablet (500 mg total) by mouth 3 (three) times daily for 10 days. 06/03/21 06/13/21  Allwardt, Alyssa M, PA-C  ?MULTIPLE VITAMIN PO Take 1 tablet by mouth daily.    [provider]  ?                                                                                                                                  ?Allergies ?Dust mite extract, Grass extracts [  gramineae pollens], and Pollen extract ? ?Review of Systems ?Review of Systems  ?Constitutional:  Negative for fatigue and fever.  ?Gastrointestinal:  Positive for abdominal pain. Negative for constipation, diarrhea, nausea and vomiting.  ?As noted in HPI ? ?Physical Exam ?Vital Signs  ?I have reviewed the triage vital signs ?BP 104/73   Pulse 80   Temp 98 ?F (36.7 ?C) (Oral)   Resp 19   Ht 6' (1.829 m)   Wt 112.9 kg   SpO2 94%   BMI 33.77 kg/m?  ? ?Physical Exam ?Vitals reviewed.  ?Constitutional:   ?   General: He is not in acute distress. ?   Appearance: He is well-developed. He is not diaphoretic.  ?HENT:  ?   Head: Normocephalic and atraumatic.  ?   Right Ear: External ear normal.  ?   Left Ear: External ear normal.  ?   Nose: Nose normal.  ?   Mouth/Throat:  ?   Mouth: Mucous membranes are moist.  ?Eyes:  ?   General: No scleral icterus. ?   Conjunctiva/sclera: Conjunctivae normal.  ?Neck:  ?   Trachea: Phonation normal.  ?Cardiovascular:  ?   Rate and Rhythm: Normal rate and regular rhythm.  ?Pulmonary:  ?    Effort: Pulmonary effort is normal. No respiratory distress.  ?   Breath sounds: No stridor.  ?Abdominal:  ?   General: There is no distension.  ?   Tenderness: There is abdominal tenderness in the left lower quadrant. There is no rebound.  ?Musculoskeletal:     ?   General: Normal range of motion.  ?   Cervical back: Normal range of motion.  ?Neurological:  ?   Mental Status: He is alert and oriented to person, place, and time.  ?Psychiatric:     ?   Behavior: Behavior normal.  ? ? ?ED Results and Treatments ?Labs ?(all labs ordered are listed, but only abnormal results are displayed) ?Labs Reviewed  ?COMPREHENSIVE METABOLIC PANEL - Abnormal; Notable for the following components:  ?    Result Value  ? Total Protein 8.3 (*)   ? All other components within normal limits  ?CBC - Abnormal; Notable for the following components:  ? WBC 11.5 (*)   ? All other components within normal limits  ?URINALYSIS, ROUTINE W REFLEX MICROSCOPIC - Abnormal; Notable for the following components:  ? Ketones, ur TRACE (*)   ? Leukocytes,Ua TRACE (*)   ? All other components within normal limits  ?LIPASE, BLOOD  ?                                                                                                                       ?EKG ? EKG Interpretation ? ?Date/Time:  Monday June 03 2021 23:28:20 EDT ?Ventricular Rate:  99 ?PR Interval:  112 ?QRS Duration: 90 ?QT Interval:  330 ?QTC Calculation: 423 ?R Axis:   44 ?Text Interpretation: Normal sinus rhythm T wave abnormality, consider inferolateral ischemia Abnormal ECG No acute  changes Confirmed by Addison Lank [phone]) on 06/04/2021 12:11:47 AM ?  ? ?  ? ?Radiology ?CT ABDOMEN PELVIS W CONTRAST ? ?Result Date: 06/04/2021 ?CLINICAL DATA:  Left lower quadrant pain for several days EXAM: CT ABDOMEN AND PELVIS WITH CONTRAST TECHNIQUE: Multidetector CT imaging of the abdomen and pelvis was performed using the standard protocol following bolus administration of intravenous contrast. RADIATION  DOSE REDUCTION: This exam was performed according to the departmental dose-optimization program which includes automated exposure control, adjustment of the mA and/or kV according to patient size and/or use of iterative reconstruction technique. CONTRAST:  175m OMNIPAQUE IOHEXOL 300 MG/ML  SOLN COMPARISON:  Plain film from the previous day. FINDINGS: Lower chest: No acute abnormality. Hepatobiliary: No focal liver abnormality is seen. No gallstones, gallbladder wall thickening, or biliary dilatation. Pancreas: Unremarkable. No pancreatic ductal dilatation or surrounding inflammatory changes. Spleen: Normal in size without focal abnormality. Adrenals/Urinary Tract: Adrenal glands are within normal limits. Kidneys demonstrate a normal enhancement pattern bilaterally. No renal calculi or obstructive changes are seen. The ureters are within normal limits. Bladder is partially distended. Stomach/Bowel: Colon shows color change with evidence of diverticulitis in the proximal sigmoid just below the junction with the descending colon. No evidence of perforation or abscess formation is seen. The more proximal colon is within normal limits. The appendix is unremarkable. Small bowel and stomach are within normal limits. Vascular/Lymphatic: No significant vascular findings are present. No enlarged abdominal or pelvic lymph nodes. Reproductive: Prostate is unremarkable. Other: No abdominal wall hernia or abnormality. No abdominopelvic ascites. Musculoskeletal: No acute or significant osseous findings. IMPRESSION: Changes consistent with sigmoid diverticulitis. No perforation or abscess formation is noted. Electronically Signed   By: MInez CatalinaM.D.   On: 06/04/2021 02:15  ? ?DG Abd 2 Views ? ?Result Date: 06/03/2021 ?CLINICAL DATA:  Left lower quadrant abdominal pain and constipation for 6 days. EXAM: ABDOMEN - 2 VIEW COMPARISON:  Abdominal radiographs 12/15/2016 FINDINGS: No intraperitoneal free air is identified. Gas is  present in scattered loops of nondilated small and large bowel without evidence of obstruction. A small amount of stool is present in the colon, predominantly in the ascending colon. The visualized lung bases a

## 2021-06-04 NOTE — Discharge Instructions (Addendum)
For pain control you may take at 1000 mg of Tylenol every 8 hours scheduled.  In addition you can take 0.5 to 1 tablet of Oxycodone every 6 hours as needed for pain not controlled with the scheduled Tylenol. ? ?If you need the oxycodone for pain control, please take a capful of MiraLAX per day to prevent constipation from narcotic use. ? ?

## 2021-06-04 NOTE — ED Notes (Signed)
Patient transported to CT 

## 2021-06-24 ENCOUNTER — Telehealth: Payer: Self-pay

## 2021-06-24 NOTE — Telephone Encounter (Signed)
See below, is this ok? ?

## 2021-06-24 NOTE — Telephone Encounter (Signed)
The pancreas was already scanned at the emergency room visit-I agree with following up with GI as the next step that I am happy to see him and we can work him in on cancellations as well if needed ? ?Did he finished antibiotics that he was given? ?

## 2021-06-24 NOTE — Telephone Encounter (Signed)
Spouse has called in stating that patient is still continuing to have abdominal pain.    She requested a scan to be order of patient pancreas.  I have scheduled patient to be seen in [place] next available on 5/11.    I have also advised patient to follow up with GI provider.  Please reach out to patient for any other advise.

## 2021-06-26 NOTE — Telephone Encounter (Signed)
Called and lm for pt tcb. 

## 2021-07-04 ENCOUNTER — Ambulatory Visit: Payer: 59 | Admitting: Family Medicine

## 2021-07-11 ENCOUNTER — Ambulatory Visit: Payer: 59 | Admitting: Family Medicine

## 2021-07-11 ENCOUNTER — Encounter: Payer: Self-pay | Admitting: Family Medicine

## 2021-07-11 VITALS — BP 122/80 | HR 68 | Temp 97.8°F | Ht 72.0 in | Wt 258.8 lb

## 2021-07-11 DIAGNOSIS — I2583 Coronary atherosclerosis due to lipid rich plaque: Secondary | ICD-10-CM

## 2021-07-11 DIAGNOSIS — K219 Gastro-esophageal reflux disease without esophagitis: Secondary | ICD-10-CM

## 2021-07-11 DIAGNOSIS — E785 Hyperlipidemia, unspecified: Secondary | ICD-10-CM

## 2021-07-11 DIAGNOSIS — I2510 Atherosclerotic heart disease of native coronary artery without angina pectoris: Secondary | ICD-10-CM | POA: Diagnosis not present

## 2021-07-11 DIAGNOSIS — K5792 Diverticulitis of intestine, part unspecified, without perforation or abscess without bleeding: Secondary | ICD-10-CM

## 2021-07-11 NOTE — Progress Notes (Signed)
Phone [phone] In person visit   Subjective:   Mark Alvarado is a 46 y.o. year old very pleasant male patient who presents for/with See problem oriented charting Chief Complaint  Patient presents with   stomach issues    Pt states he is felling better as far as his stomach issues but he would like a scan of pancreas.   Past Medical History-  Patient Active Problem List   Diagnosis Date Noted   Hyperlipidemia 12/28/2019    Priority: Medium    Essential hypertension 12/28/2019    Priority: Medium    History of DVT (deep vein thrombosis) 07/23/2017    Priority: Medium    Allergic rhinitis 12/28/2019    Priority: Low   Childhood asthma 12/28/2019    Priority: Low   GERD (gastroesophageal reflux disease) 12/28/2019    Priority: Low   Heart murmur 12/28/2019    Priority: Low   NSVT (nonsustained ventricular tachycardia) ([place]) 02/04/2021   Nonspecific abnormal electrocardiogram (ECG) (EKG) 02/04/2021   Diverticulosis of colon without hemorrhage 03/29/2020   Family hx of colon cancer 03/29/2020    Medications- reviewed and updated Current Outpatient Medications  Medication Sig Dispense Refill   atorvastatin (LIPITOR) 20 MG tablet Take 2 tablets (40 mg total) by mouth daily. 90 tablet 3   hyoscyamine (LEVSIN SL) 0.125 MG SL tablet Place under the tongue 2 (two) times daily.     MULTIPLE VITAMIN PO Take 1 tablet by mouth daily.     No current facility-administered medications for this visit.     Objective:  BP 122/80   Pulse 68   Temp 97.8 F (36.6 C)   Ht 6' (1.829 m)   Wt 258 lb 12.8 oz (117.4 kg)   SpO2 98%   BMI 35.10 kg/m  Gen: NAD, resting comfortably CV: RRR no murmurs rubs or gallops Lungs: CTAB no crackles, wheeze, rhonchi Abdomen: soft/nontender/nondistended/normal bowel sounds. No rebound or guarding.  Ext: no edema     Assessment and Plan   #Tremendous work stress-2 of the key people/higher ups in patients organization are down with  illness-patient is taking on a ton of work and has a heavy upcoming work schedule. -We discussed attempting to build margin into his schedule-would love for him to have some time for himself as well as his wife -He is considering a short-term trip in July to [place] or [place] potentially-I think this would be a nice break for him  # Constipation/abdominal distress/diverticulitis also with history of GERD S:Medication: None at present  -On 04/26/2021 at physical patient reported improved overall GI issues.  Vinegar helped if needed in the past.  He had not experienced much reflux on plant-based diet. -Hyoscyamine was helpful for mild IBS symptoms prior to plant-based diet but with plant-based diet and largely resolved  -Presented again on 06/03/2021 with Alyssa Allwardt, PA with concern of lower abdominal pain for [redacted] week along with constipation.  Seem to go back and forth in constipation and diarrhea over 6-day.  Along with the abdominal pain.  He had started Lipitor twice daily by Dr. Percival Spanish (on Rx but plan per notes was only 20 mg daily) the same day the symptoms started.  Had tried heating pad as well as MiraLAX.  Up-to-date on colonoscopy 04/26/2020-did have 4 polyps and diverticulosis.  CBC with mild leukocytosis.  2 view abdominal films largely reassuring.  It was thought most likely related to significant constipation initially.  Also thought that Lipitor could play a role-was instructed to  stop medicine and start back after several days doing every other day for a few weeks then daily to see how he tolerates it.  Unfortunately patient's pain worsened and had to be seen in the emergency room where diverticulitis was confirmed.  After white count was noted elevated the plan had transition to stat CT abdomen pelvis but patient had already been seen.  He was started on ciprofloxacin and Flagyl and discharged home  A/P: Diverticulitis has resolved-benign abdominal exam  Patient was concerned  about his pancreas-we reviewed imaging together as well as imaging report of normal pancreas-does not need further evaluation at this time  For constipation- From AVS:  " Lets try miralax 1 capful daily for next week- hopeful we can get a daily bowel movement if not 2 and hopefully less straining. If you have loose stool take a day off then restart half capful daily. Honestly you could take this long term if you want but maybe within a few weeks you find a better set point and can pull back.  "  He also has GI follow-up in 8 days which will be a good chance to check in  See discussion about cholesterol medicine below  #hyperlipidemia #Nonobstructive CAD-follows with Dr. Percival Spanish S: Medication: Atorvastatin 20 mg daily-he did come off and then gradually reintroduce-not having any issues this time.  He did not report chest pain or shortness of breath today. Lab Results  Component Value Date   CHOL 190 05/27/2021   HDL 37 (L) 05/27/2021   LDLCALC 130 (H) 05/27/2021   TRIG 129 05/27/2021   CHOLHDL 5.1 (H) 05/27/2021   A/P:-he will have follow-up with Dr. Percival Spanish on lipids or we can recheck at next physical.  For nonobstructive CAD-hopeful for LDL under 70 on repeat-continue current medicine for now atorvastatin 20 mg daily-does not need aspirin at this time  Recommended follow up: Schedule 04/28/22 or later for physical with me before you leave Future Appointments  Date Time Provider [place]  07/19/2021  8:30 AM Levin Erp, PA [facility]    Lab/Order associations:   ICD-10-CM   1. Diverticulitis  K57.92     2. Hyperlipidemia, unspecified hyperlipidemia type  E78.5     3. Gastroesophageal reflux disease, unspecified whether esophagitis present  K21.9     4. Coronary artery disease due to lipid rich plaque  I25.10    I25.83       Return precautions advised.  Garret Reddish, MD

## 2021-07-11 NOTE — Patient Instructions (Addendum)
Lets try miralax 1 capful daily for next week- hopeful we can get a daily bowel movement if not 2 and hopefully less straining. If you have loose stool take a day off then restart half capful daily. Honestly you could take this long term if you want but maybe within a few weeks you find a better set point and can pull back.   Also scheduled 07/19/21 with GI  Recommended follow up: GERD remains controlled with dietary changes

## 2021-07-18 ENCOUNTER — Ambulatory Visit: Payer: 59 | Admitting: Physician Assistant

## 2021-07-19 ENCOUNTER — Encounter: Payer: Self-pay | Admitting: Physician Assistant

## 2021-07-19 ENCOUNTER — Ambulatory Visit: Payer: 59 | Admitting: Physician Assistant

## 2021-07-19 VITALS — BP 126/82 | HR 65 | Ht 72.0 in | Wt 257.2 lb

## 2021-07-19 DIAGNOSIS — K5909 Other constipation: Secondary | ICD-10-CM | POA: Diagnosis not present

## 2021-07-19 DIAGNOSIS — Z8719 Personal history of other diseases of the digestive system: Secondary | ICD-10-CM | POA: Diagnosis not present

## 2021-07-19 NOTE — Progress Notes (Signed)
Reviewed and agree with documentation and assessment and plan. K. Veena Amellia Panik , MD   

## 2021-07-19 NOTE — Patient Instructions (Signed)
If you are age 48 or older, your body mass index should be between 23-30. Your Body mass index is 34.88 kg/m. If this is out of the aforementioned range listed, please consider follow up with your Primary Care Provider.  If you are age 80 or younger, your body mass index should be between 19-25. Your Body mass index is 34.88 kg/m. If this is out of the aformentioned range listed, please consider follow up with your Primary Care Provider.   Start Align once daily.  Continue Benefiber.  Start Miralax daily   The [facility] providers would like to encourage you to use [facility] to communicate with providers for non-urgent requests or questions.  Due to long hold times on the telephone, sending your provider a message by [facility] may be a faster and more efficient way to get a response.  Please allow 48 business hours for a response.  Please remember that this is for non-urgent requests.   It was a pleasure to see you today!  Thank you for trusting me with your gastrointestinal care!    Ellouise Newer, PA-C

## 2021-07-19 NOTE — Progress Notes (Signed)
Chief Complaint: Abdominal Pain and Change in Bowel Habits  HPI:    Mark Alvarado is a 46 year old male, known to Dr. Silverio Decamp who was referred to me by Marin Olp, MD for a complaint of abdominal pain and change in bowel habits.      04/26/2020 colonoscopy for clinically significant diarrhea with four 4-7 mm polyps in the sigmoid, descending and ascending colon, diverticulosis in the sigmoid, descending, transverse, ascending colon and in the cecum and nonbleeding external and internal hemorrhoids.  Pathology showed hyperplastic polyps and benign mucosa.  Repeat colonoscopy recommended in 10 years.    06/03/2021 patient seen in the ER for diverticulitis.  At that time WBC 11.5.  CT with changes consistent with sigmoid diverticulitis with no perforation or abscess.  Patient was given oxycodone and Zofran.  Patient was given Flagyl and Cipro.    07/11/2021 patient follow-up with PCP.  At that time discussed that hyoscyamine has been helpful for mild IBS symptoms.  Discussed that initially he had an x-ray which showed constipation and eventually followed in the ER with a CT showing diverticulitis which had resolved.    Today, the patient tells me that he really feels fine but just has some questions about diverticulitis and has abdominal pain going forward.  Currently he has a bowel movement about every day or every few days.  He has some questions in regards to what to do about that as well as feeling like he still has stool in his colon at times.    Denies fever, chills, weight loss or blood in his stool.  Past Medical History:  Diagnosis Date   Asthma    As a child   DVT (deep venous thrombosis) ([place])    Hyperlipidemia    Hypertension     Past Surgical History:  Procedure Laterality Date   left knee arthroscopic     1994- football injury    ROTATOR CUFF REPAIR Right    march 2019   ROTATOR CUFF REPAIR Left    Partial and AC joint repair december 2020    Current Outpatient Medications   Medication Sig Dispense Refill   atorvastatin (LIPITOR) 20 MG tablet Take 2 tablets (40 mg total) by mouth daily. 90 tablet 3   hyoscyamine (LEVSIN SL) 0.125 MG SL tablet Place under the tongue 2 (two) times daily.     MULTIPLE VITAMIN PO Take 1 tablet by mouth daily.     No current facility-administered medications for this visit.    Allergies as of 07/19/2021 - Review Complete 07/11/2021  Allergen Reaction Noted   Dust mite extract  12/27/2019   Grass extracts [gramineae pollens]  12/27/2019   Pollen extract  12/27/2019    Family History  Problem Relation Age of Onset   High Cholesterol Mother    Hypertension Mother    High Cholesterol Father    Hypertension Father    Kidney disease Father        agent orange exposure- 100% disability   Colon cancer Sister        rare and typically find in men and boys- died in 44 months.    Colon cancer Maternal Aunt    Colon cancer Maternal Grandmother    Alzheimer's disease Paternal Grandmother    Healthy Sister    Other Maternal Grandfather        accidental   Heart attack Paternal Grandfather        early 24s   Colon cancer Paternal Uncle  Pancreatic cancer Neg Hx    Esophageal cancer Neg Hx    Stomach cancer Neg Hx    Liver disease Neg Hx     Social History   Socioeconomic History   Marital status: Married    Spouse name: Not on file   Number of children: Not on file   Years of education: Not on file   Highest education level: Not on file  Occupational History   Not on file  Tobacco Use   Smoking status: Never   Smokeless tobacco: Never  Vaping Use   Vaping Use: Never used  Substance and Sexual Activity   Alcohol use: Yes    Comment: rare   Drug use: Never   Sexual activity: Yes    Partners: Female  Other Topics Concern   Not on file  Social History Narrative   Married 2008. Thomasena Edis (2014), Ellard Artis (2017). Mother in law lives with them.       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 [employer] position   Was Doctor, general practice at [employer]: time with family and sons, walking in mornings, date night once a week with wife, church   Social Determinants of Radio broadcast assistant Strain: Not on file  Food Insecurity: Not on file  Transportation Needs: Not on file  Physical Activity: Not on file  Stress: Not on file  Social Connections: Not on file  Intimate Partner Violence: Not on file    Review of Systems:    Constitutional: No weight loss, fever or chills Cardiovascular: No chest pain Respiratory: No SOB  Gastrointestinal: See HPI and otherwise negative   Physical Exam:  Vital signs: BP 126/82   Pulse 65   Ht 6' (1.829 m)   Wt 257 lb 3.2 oz (116.7 kg)   SpO2 98%   BMI 34.88 kg/m    Constitutional:   Pleasant AA male appears to be in NAD, Well developed, Well nourished, alert and cooperative Respiratory: Respirations even and unlabored. Lungs clear to auscultation bilaterally.   No wheezes, crackles, or rhonchi.  Cardiovascular: Normal S1, S2. No MRG. Regular rate and rhythm. No peripheral edema, cyanosis or pallor.  Gastrointestinal:  Soft, nondistended, nontender. No rebound or guarding. Normal bowel sounds. No appreciable masses or hepatomegaly. Rectal:  Not performed.  Psychiatric: Oriented to person, place and time. Demonstrates good judgement and reason without abnormal affect or behaviors.  RELEVANT LABS AND IMAGING: CBC    Component Value Date/Time   WBC 11.5 (H) 06/03/2021 2320   RBC 4.97 06/03/2021 2320   HGB 13.7 06/03/2021 2320   HCT 43.0 06/03/2021 2320   PLT 280 06/03/2021 2320   MCV 86.5 06/03/2021 2320   MCH 27.6 06/03/2021 2320   MCHC 31.9 06/03/2021 2320   RDW 13.2 06/03/2021 2320   LYMPHSABS 2.4 06/03/2021 0911   MONOABS 0.7  06/03/2021 0911   EOSABS 0.1 06/03/2021 0911   BASOSABS 0.1 06/03/2021 0911    CMP     Component Value Date/Time   NA 138 06/03/2021 2320   NA 141 01/07/2021 1606   K 4.4 06/03/2021 2320   CL 101 06/03/2021 2320   CO2 27 06/03/2021 2320   GLUCOSE 91 06/03/2021 2320   BUN 13 06/03/2021 2320   BUN 15  01/07/2021 1606   CREATININE 0.93 06/03/2021 2320   CALCIUM 9.8 06/03/2021 2320   PROT 8.3 (H) 06/03/2021 2320   ALBUMIN 4.7 06/03/2021 2320   AST 21 06/03/2021 2320   ALT 17 06/03/2021 2320   ALKPHOS 42 06/03/2021 2320   BILITOT 0.8 06/03/2021 2320   GFRNONAA >60 06/03/2021 2320   GFRAA 87 10/27/2018 0000    Assessment: 1.  History of diverticulitis: Recent CT documented episode of diverticulitis resolved on Flagyl and Cipro, recent colonoscopy normal other than diverticular pockets and some hyperplastic polyps with repeat recommended in 10 years, currently patient doing well 2.  Constipation: With above  Plan: 1.  Discussed the pathophysiology of diverticulitis with the patient.  Explained that when he is getting some discomfort then would recommend that he go on a clear liquid diet/low fiber for at least 3 days to see if the pain resolves, if it does not then he may need to call and let us know and we can prescribe some antibiotics. 2.  Patient asked about probiotics.  Discussed Align in specific daily for the next month to see if he feels any better on this, if not he can discontinue 3.  Encouraged the patient to start a dose of MiraLAX on a daily basis it sounds like he has a baseline of constipation. 4.  Encouraged the patient continue his Fiber supplement and Water intake. 5.  Patient follow in clinic with [place] as needed.  Ellouise Newer, PA-C [facility] 07/19/2021, 8:24 AM  Cc: Marin Olp, MD

## 2021-10-30 ENCOUNTER — Encounter: Payer: Self-pay | Admitting: *Deleted

## 2021-11-07 ENCOUNTER — Encounter: Payer: Self-pay | Admitting: Family Medicine

## 2021-11-07 ENCOUNTER — Ambulatory Visit: Payer: 59 | Admitting: Family Medicine

## 2021-11-07 VITALS — BP 112/70 | HR 84 | Temp 97.9°F | Ht 72.0 in | Wt 258.0 lb

## 2021-11-07 DIAGNOSIS — K921 Melena: Secondary | ICD-10-CM | POA: Diagnosis not present

## 2021-11-07 DIAGNOSIS — I10 Essential (primary) hypertension: Secondary | ICD-10-CM | POA: Diagnosis not present

## 2021-11-07 DIAGNOSIS — E785 Hyperlipidemia, unspecified: Secondary | ICD-10-CM

## 2021-11-07 DIAGNOSIS — K5792 Diverticulitis of intestine, part unspecified, without perforation or abscess without bleeding: Secondary | ICD-10-CM

## 2021-11-07 DIAGNOSIS — Z23 Encounter for immunization: Secondary | ICD-10-CM

## 2021-11-07 LAB — LIPID PANEL
Chol/HDL Ratio: 3.7 ratio (ref 0.0–5.0)
Cholesterol, Total: 159 mg/dL (ref 100–199)
HDL: 43 mg/dL (ref >39)
LDL Chol Calc (NIH): 100 mg/dL — ABNORMAL HIGH (ref 0–99)
Triglycerides: 84 mg/dL (ref 0–149)
VLDL Cholesterol Cal: 16 mg/dL (ref 5–40)

## 2021-11-07 NOTE — Patient Instructions (Addendum)
Stick with liquid or bland diet for now- can advance as you continue to feel better. Finish antibiotics  We considered CT but with already taking antibiotics we opted out  If not feeling better from bloating like/abdominal fullness sensatoin within next 2-4 weeks consider GI follow up  Keep up efforts on healthy eating/regular exercise  Recommended follow up: Return for next already scheduled visit or sooner if needed.

## 2021-11-07 NOTE — Progress Notes (Signed)
Phone [phone] In person visit   Subjective:   Mark Alvarado is a 46 y.o. year old very pleasant male patient who presents for/with See problem oriented charting Chief Complaint  Patient presents with   Diverticulitis    Pt c/o possible diverticulitis flare due to vomiting and diarrhea that started Sunday that was persistent through out the night , decrease in appetite with lower abd pain like in April when diagnosed with diverticulitis. Took meds he had left over from episode in April he did have a black tarry stool last night.   Past Medical History-  Patient Active Problem List   Diagnosis Date Noted   Hyperlipidemia 12/28/2019    Priority: Medium    Essential hypertension 12/28/2019    Priority: Medium    History of DVT (deep vein thrombosis) 07/23/2017    Priority: Medium    Allergic rhinitis 12/28/2019    Priority: Low   Childhood asthma 12/28/2019    Priority: Low   GERD (gastroesophageal reflux disease) 12/28/2019    Priority: Low   Heart murmur 12/28/2019    Priority: Low   NSVT (nonsustained ventricular tachycardia) ([place]) 02/04/2021   Nonspecific abnormal electrocardiogram (ECG) (EKG) 02/04/2021   Diverticulosis of colon without hemorrhage 03/29/2020   Family hx of colon cancer 03/29/2020    Medications- reviewed and updated Current Outpatient Medications  Medication Sig Dispense Refill   hyoscyamine (LEVSIN SL) 0.125 MG SL tablet Place under the tongue 2 (two) times daily.     MULTIPLE VITAMIN PO Take 1 tablet by mouth daily.     atorvastatin (LIPITOR) 20 MG tablet Take 2 tablets (40 mg total) by mouth daily. 90 tablet 3   No current facility-administered medications for this visit.     Objective:  BP 112/70   Pulse 84   Temp 97.9 F (36.6 C)   Ht 6' (1.829 m)   Wt 258 lb (117 kg)   SpO2 96%   BMI 34.99 kg/m  Gen: NAD, resting comfortably CV: RRR no murmurs rubs or gallops Lungs: CTAB no crackles, wheeze, rhonchi Abdomen: soft/nontender other  than suprapubic at least moderate level pain almost pinpoint tenderness with palpation/nondistended/normal bowel sounds. No rebound or guarding.  Ext: trace edema Skin: warm, dry    Assessment and Plan   #Patient concern for diverticulitis flare S: On Saturday night into Sunday morning patient started with vomiting (no blood or bile reported ) and diarrhea that was persistent throughout the night (had some veggie dip at a party as well as pizza later in the night but from a normal place dominoes and typical pizza) and associated decrease in appetite as well as  diffuse abdominal strain/cramping from vomiting/diarrhea. Still doing plant based diet/high fiber in general. no pain with urination. No urinary frequency.   On Monday noted lower abdominal pain. Did mainly water and avoided food that day. Every 30 minutes got surges of pain. Tried heating pad Monday evening but continued to have pain in next day. Tuesday had more rectal discomfort and constipation.   Felt very similar to symptoms in April when diagnosed with diverticulitis.  He took some leftover ciprofloxacin and Flagyl from the prior visit for diverticulitis in april(only took 5 of the 10 day meds previously) . Fluids only Sunday to Wednesday- along with the antibiotics. Had a salad yesterday and ended up having a black tarry thick stool. Had another dark tarry stool this morning. Of note did take pepto bismol several times in last few days- 4 tablets.  Bloating like sensation started since august when had prednisone- finished over a month ago.  A/P: This certainly has the chance of representing another diverticulitis flare. Since he has already had several days of antibiotics (though we discussed this is not first line treatment now) we opted to have him finish 5 days. We thought this made imaging likely lower yield and opted to hold off for now  . 2 attacks within 6 months- discussed possible need for surgery in the future as well but will  not refer at this time -did have possible melena but think more likely related to pepto bismol- will check cbc and cmp with labs  Patient also has a sensation of bloating in abdomen from over a month ago when he had prednisone. Apparently sister's colon cancer history (that she died of within 57 months) had an atypical presentation that started mainly with bloating. He was agreeable to following up with GI if symptoms fail to improve over next 2-4 weeks.     #hypertension S: medication: treated with dietary changes/weight loss BP Readings from Last 3 Encounters:  11/07/21 112/70  07/19/21 126/82  07/11/21 122/80  A/P: Doing well with dietary changes-plant-based diet.  Controlled without medication-continue to monitor   #Nonobstructive CAD  #hyperlipidemia S: Medication: Atorvastatin 40 mg -No exertional element to abdominal pain listed above.  No chest pain or shortness of breath reported-doing well with walking.  Palpitations from the past no longer present Lab Results  Component Value Date   CHOL 159 11/07/2021   HDL 43 11/07/2021   LDLCALC 100 (H) 11/07/2021   TRIG 84 11/07/2021   CHOLHDL 3.7 11/07/2021   A/P: Nonobstructive CAD remains asymptomatic-continue current medicine.  Lipids above goal on last check but reports had labs drawn earlier today for lipids-I do not see in the system but we will keep an eye out.  Continue current medication for now   Recommended follow up: Return for next already scheduled visit or sooner if needed. Future Appointments  Date Time Provider [place]  04/28/2022  1:00 PM Marin Olp, MD [facility]    Lab/Order associations:   ICD-10-CM   1. Diverticulitis  K57.92     2. Melena  K92.1 CBC with Differential/Platelet    Comprehensive metabolic panel    3. Need for immunization against influenza  Z23 Flu Vaccine QUAD 12moIM (Fluarix, Fluzone & Alfiuria Quad PF)    4. Essential hypertension  I10     5. Hyperlipidemia,  unspecified hyperlipidemia type  E78.5       No orders of the defined types were placed in this encounter.   Return precautions advised.  SGarret Reddish MD

## 2021-11-08 LAB — COMPREHENSIVE METABOLIC PANEL
ALT: 19 U/L (ref 0–53)
AST: 27 U/L (ref 0–37)
Albumin: 4.4 g/dL (ref 3.5–5.2)
Alkaline Phosphatase: 56 U/L (ref 39–117)
BUN: 16 mg/dL (ref 6–23)
CO2: 30 mEq/L (ref 19–32)
Calcium: 9.6 mg/dL (ref 8.4–10.5)
Chloride: 102 mEq/L (ref 96–112)
Creatinine, Ser: 1.15 mg/dL (ref 0.40–1.50)
GFR: 75.26 mL/min (ref >60.00)
Glucose, Bld: 80 mg/dL (ref 70–99)
Potassium: 4.6 mEq/L (ref 3.5–5.1)
Sodium: 139 mEq/L (ref 135–145)
Total Bilirubin: 0.7 mg/dL (ref 0.2–1.2)
Total Protein: 8.1 g/dL (ref 6.0–8.3)

## 2021-11-08 LAB — CBC WITH DIFFERENTIAL/PLATELET
Basophils Absolute: 0 10*3/uL (ref 0.0–0.1)
Basophils Relative: 0.5 % (ref 0.0–3.0)
Eosinophils Absolute: 0.2 10*3/uL (ref 0.0–0.7)
Eosinophils Relative: 2.2 % (ref 0.0–5.0)
HCT: 42.1 % (ref 39.0–52.0)
Hemoglobin: 13.8 g/dL (ref 13.0–17.0)
Lymphocytes Relative: 25.9 % (ref 12.0–46.0)
Lymphs Abs: 2.2 10*3/uL (ref 0.7–4.0)
MCHC: 32.7 g/dL (ref 30.0–36.0)
MCV: 86.3 fl (ref 78.0–100.0)
Monocytes Absolute: 0.6 10*3/uL (ref 0.1–1.0)
Monocytes Relative: 7 % (ref 3.0–12.0)
Neutro Abs: 5.5 10*3/uL (ref 1.4–7.7)
Neutrophils Relative %: 64.4 % (ref 43.0–77.0)
Platelets: 241 10*3/uL (ref 150.0–400.0)
RBC: 4.88 Mil/uL (ref 4.22–5.81)
RDW: 13.7 % (ref 11.5–15.5)
WBC: 8.6 10*3/uL (ref 4.0–10.5)

## 2021-11-13 ENCOUNTER — Encounter: Payer: Self-pay | Admitting: *Deleted

## 2022-04-28 ENCOUNTER — Encounter: Payer: 59 | Admitting: Family Medicine

## 2022-08-27 ENCOUNTER — Encounter: Payer: Self-pay | Admitting: Family Medicine

## 2022-08-27 ENCOUNTER — Ambulatory Visit (INDEPENDENT_AMBULATORY_CARE_PROVIDER_SITE_OTHER): Payer: BC Managed Care – PPO | Admitting: Family Medicine

## 2022-08-27 VITALS — BP 132/82 | HR 71 | Temp 97.9°F | Ht 73.0 in | Wt 259.6 lb

## 2022-08-27 DIAGNOSIS — E785 Hyperlipidemia, unspecified: Secondary | ICD-10-CM

## 2022-08-27 DIAGNOSIS — Z125 Encounter for screening for malignant neoplasm of prostate: Secondary | ICD-10-CM

## 2022-08-27 DIAGNOSIS — Z131 Encounter for screening for diabetes mellitus: Secondary | ICD-10-CM

## 2022-08-27 DIAGNOSIS — E669 Obesity, unspecified: Secondary | ICD-10-CM

## 2022-08-27 DIAGNOSIS — I10 Essential (primary) hypertension: Secondary | ICD-10-CM | POA: Diagnosis not present

## 2022-08-27 DIAGNOSIS — Z0000 Encounter for general adult medical examination without abnormal findings: Secondary | ICD-10-CM

## 2022-08-27 DIAGNOSIS — I4729 Other ventricular tachycardia: Secondary | ICD-10-CM

## 2022-08-27 LAB — CBC WITH DIFFERENTIAL/PLATELET
Basophils Absolute: 0 10*3/uL (ref 0.0–0.1)
Basophils Relative: 0.5 % (ref 0.0–3.0)
Eosinophils Absolute: 0.1 10*3/uL (ref 0.0–0.7)
Eosinophils Relative: 1.9 % (ref 0.0–5.0)
HCT: 43.4 % (ref 39.0–52.0)
Hemoglobin: 13.9 g/dL (ref 13.0–17.0)
Lymphocytes Relative: 30 % (ref 12.0–46.0)
Lymphs Abs: 2.2 10*3/uL (ref 0.7–4.0)
MCHC: 32 g/dL (ref 30.0–36.0)
MCV: 84.7 fl (ref 78.0–100.0)
Monocytes Absolute: 0.5 10*3/uL (ref 0.1–1.0)
Monocytes Relative: 7 % (ref 3.0–12.0)
Neutro Abs: 4.4 10*3/uL (ref 1.4–7.7)
Neutrophils Relative %: 60.6 % (ref 43.0–77.0)
Platelets: 246 10*3/uL (ref 150.0–400.0)
RBC: 5.12 Mil/uL (ref 4.22–5.81)
RDW: 14.3 % (ref 11.5–15.5)
WBC: 7.3 10*3/uL (ref 4.0–10.5)

## 2022-08-27 LAB — LIPID PANEL
Cholesterol: 201 mg/dL — ABNORMAL HIGH (ref 0–200)
HDL: 34.1 mg/dL — ABNORMAL LOW (ref >39.00)
LDL Cholesterol: 145 mg/dL — ABNORMAL HIGH (ref 0–99)
NonHDL: 166.5
Total CHOL/HDL Ratio: 6
Triglycerides: 109 mg/dL (ref 0.0–149.0)
VLDL: 21.8 mg/dL (ref 0.0–40.0)

## 2022-08-27 LAB — COMPREHENSIVE METABOLIC PANEL
ALT: 21 U/L (ref 0–53)
AST: 21 U/L (ref 0–37)
Albumin: 4.3 g/dL (ref 3.5–5.2)
Alkaline Phosphatase: 47 U/L (ref 39–117)
BUN: 18 mg/dL (ref 6–23)
CO2: 27 mEq/L (ref 19–32)
Calcium: 9.4 mg/dL (ref 8.4–10.5)
Chloride: 106 mEq/L (ref 96–112)
Creatinine, Ser: 1.02 mg/dL (ref 0.40–1.50)
GFR: 86.42 mL/min (ref >60.00)
Glucose, Bld: 88 mg/dL (ref 70–99)
Potassium: 4.3 mEq/L (ref 3.5–5.1)
Sodium: 139 mEq/L (ref 135–145)
Total Bilirubin: 0.3 mg/dL (ref 0.2–1.2)
Total Protein: 7.4 g/dL (ref 6.0–8.3)

## 2022-08-27 LAB — PSA: PSA: 1.5 ng/mL (ref 0.10–4.00)

## 2022-08-27 LAB — HEMOGLOBIN A1C: Hgb A1c MFr Bld: 6 % (ref 4.6–6.5)

## 2022-08-27 NOTE — Telephone Encounter (Signed)
See below

## 2022-08-27 NOTE — Progress Notes (Signed)
Phone: [phone]    Subjective:  Patient presents today for their annual physical. Chief complaint-noted.   See problem oriented charting- ROS- full  review of systems was completed and negative  Per full ROS sheet completed by patient  The following were reviewed and entered/updated in epic: Past Medical History:  Diagnosis Date   Allergy    Asthma    As a child   DVT (deep venous thrombosis) (HCC)    Hyperlipidemia    Hypertension    Patient Active Problem List   Diagnosis Date Noted   NSVT (nonsustained ventricular tachycardia) (HCC) 02/04/2021    Priority: Medium    Hyperlipidemia 12/28/2019    Priority: Medium    Essential hypertension 12/28/2019    Priority: Medium    History of DVT (deep vein thrombosis) 07/23/2017    Priority: Medium    Nonspecific abnormal electrocardiogram (ECG) (EKG) 02/04/2021    Priority: Low   Diverticulosis of colon without hemorrhage 03/29/2020    Priority: Low   Family hx of colon cancer 03/29/2020    Priority: Low   Allergic rhinitis 12/28/2019    Priority: Low   Childhood asthma 12/28/2019    Priority: Low   GERD (gastroesophageal reflux disease) 12/28/2019    Priority: Low   Heart murmur 12/28/2019    Priority: Low   Past Surgical History:  Procedure Laterality Date   COLONOSCOPY     left knee arthroscopic     1994- football injury    ROTATOR CUFF REPAIR Right    march 2019   ROTATOR CUFF REPAIR Left    Partial and AC joint repair december 2020    Family History  Problem Relation Age of Onset   High Cholesterol Mother    Hypertension Mother    High Cholesterol Father    Hypertension Father    Kidney disease Father        agent orange exposure- 100% disability   COPD Father    Colon cancer Sister        rare and typically find in men and boys- died in 8 months.    Cancer Sister    Colon cancer Maternal Aunt    Colon cancer Maternal Grandmother    Alzheimer's disease Paternal Grandmother    Healthy Sister     Other Maternal Grandfather        accidental   Heart attack Paternal Grandfather        early 62s   Colon cancer Paternal Uncle    Pancreatic cancer Neg Hx    Esophageal cancer Neg Hx    Stomach cancer Neg Hx    Liver disease Neg Hx     Medications- reviewed and updated Current Outpatient Medications  Medication Sig Dispense Refill   MULTIPLE VITAMIN PO Take 1 tablet by mouth daily.     hyoscyamine (LEVSIN SL) 0.125 MG SL tablet Place under the tongue 2 (two) times daily.     No current facility-administered medications for this visit.    Allergies-reviewed and updated Allergies  Allergen Reactions   Dust Mite Extract    Grass Extracts [Gramineae Pollens]    Pollen Extract     Social History   Social History Narrative   Married 2008. Enid Derry (2014), Clayburn Pert (2017). Mother in law lives with them.       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 Football for 2  years and was on track team   Doctorate [employer]- global sport leadership- develops international skills but ended  Up with [employer] position   Was Engineering geologist at [employer]: time with family and sons, walking in mornings, date night once a week with wife, church      Objective:  BP 132/82   Pulse 71   Temp 97.9 F (36.6 C)   Ht 6\' 1"  (1.854 m)   Wt 259 lb 9.6 oz (117.8 kg)   SpO2 97%   BMI 34.25 kg/m  Gen: NAD, resting comfortably HEENT: Mucous membranes are moist. Oropharynx normal Neck: no thyromegaly CV: RRR no murmurs rubs or gallops Lungs: CTAB no crackles, wheeze, rhonchi Abdomen: soft/nontender/nondistended/normal bowel sounds. No rebound or guarding.  Ext: no edema Skin: warm, dry Neuro: grossly normal, moves all extremities, PERRLA     Assessment and Plan:  47 y.o. male presenting for annual physical.  Health  Maintenance counseling: 1. Anticipatory guidance: Patient counseled regarding regular dental exams - generally q6 months, eye exams - [facility],  avoiding smoking and second hand smoke , limiting alcohol to 2 beverages per day- sparing alcohol, no illicit drugs.   2. Risk factor reduction:  Advised patient of need for regular exercise and diet rich and fruits and vegetables to reduce risk of heart attack and stroke.  Exercise- financial rep with [employer] driving to [place] so exercise has fallen off. Wants to add . Some fitness center at [facility].  Diet/weight management-up 8 lbs from Comprehensive Physical Exam (CPE) preventive care annual visit - still down from peak of 275- no meat in diet last year- but started back first of year- doing fish, [place] chicken limited now.  -3 weeks ago tried to cut out added sugars, added bread last week, this week he cut out rice. Down 9 lbs with this- had been higher up to 270. Long term goal under 250 by age 59 Wt Readings from Last 3 Encounters:  08/27/22 259 lb 9.6 oz (117.8 kg)  11/07/21 258 lb (117 kg)  07/19/21 257 lb 3.2 oz (116.7 kg)  3. Immunizations/screenings/ancillary studies- consider fall COVID shot  Immunization History  Administered Date(s) Administered   Influenza,inj,Quad PF,6+ Mos 12/28/2019, 11/07/2021   Influenza-Unspecified 12/12/2020   Moderna SARS-COV2 Booster Vaccination 02/01/2020   Moderna Sars-Covid-2 Vaccination 04/28/2019, 05/31/2019   Pfizer Covid-19 Vaccine Bivalent Booster 39yrs & up 12/12/2020   Tdap 09/25/2014  4. Prostate cancer screening- continue to trend annually- low risk trend  Lab Results  Component Value Date   PSA 1.19 04/26/2021   PSA 1.17 04/25/2020   5. Colon cancer screening - 04/26/20 with 5 year repeat with Dr. Lavon Paganini likely due to family history although letter said 10 years- precancerous polyps in dad and colon cancer in sister (but told rare and unlikely to increase his risk) 6. Skin  cancer screening/prevention- lower risk due to melanin content. advised regular sunscreen use. Denies worrisome, changing, or new skin lesions.  7. Testicular cancer screening- advised monthly self exams  8. STD screening- patient opts out- only active with wife 9. Smoking associated screening- never smoker  Status of chronic or acute concerns   #social update -wife dealing with significant anemia and has fibroids causing issues- plans on hysterectomy- had reassuring urgent biopsy thankfully- no cancer- thankful as breast cancer survivor  #palpitations- nonsustained ventricular tachycardia (NSVT) and palpitations in past. Watched gamechangers and came off meat July 2022- on  multivitamin sparingly. Also cut out caffeine and no palpitations. Has introduced meat back in and doing well stil.   #hypertension S: medication: none- high acceptable with dietary change BP Readings from Last 3 Encounters:  08/27/22 132/82  11/07/21 112/70  07/19/21 126/82  A/P: mildly elevated - but still acceptable range- I prefer at least 135/85 though some guidelines say 130/80- he is going to work on healthy eating and regular exercise and recheck 6 months  #hyperlipidemia #nonobstructive CAD- coronary artery calcium score of 5 01/09/21 at 82% S: Medication: red yeast rice in past - tolerated -crestor caused abdomen pain with crunches and had high ck Lab Results  Component Value Date   CHOL 159 11/07/2021   HDL 43 11/07/2021   LDLCALC 100 (H) 11/07/2021   TRIG 84 11/07/2021   CHOLHDL 3.7 11/07/2021   A/P: lipids mildly elevated with LDL 100 and with plaque on heart prefer under 70- may reconsider red yeast rice or alternate statin  #GERD- vinegar helped in past but on plant based diet resolved- still no issues -mild IBS- hyoscyamine helps- not needing  #history provoked deep vein thrombosis after rotator cuff repair   #mild hearing loss- checked 2022 audiology- they said could repeat in 2 years. Hold  off for now- maybe next year as things settle  Recommended follow up: Return in about 6 months (around 02/27/2023) for followup or sooner if needed.Schedule b4 you leave.  Lab/Order associations: fasting   ICD-10-CM   1. Routine general medical examination at a health care facility  Z00.00     2. Essential hypertension  I10     3. Hyperlipidemia, unspecified hyperlipidemia type  E78.5 Comprehensive metabolic panel    CBC with Differential/Platelet    Lipid panel    4. Screening for prostate cancer  Z12.5 PSA      No orders of the defined types were placed in this encounter.   Return precautions advised.   Tana Conch, MD

## 2022-08-27 NOTE — Patient Instructions (Addendum)
Let us know if you get any more COVID vaccines in the fall  I like your 250 goal- look forward to seeing how you are doing at follow up -if you feel you are hitting plateau with your changes consider ... I suggest myfitnesspal Use 0.5 pounds per week weight loss goal or 1 lb Set a reasonable goal such as 5-10 lbs and can reset goal once you reach it Do not connect your step counter to this- watch or phone Update me in 2-3 months with how you are doing  Please stop by lab before you go If you have mychart- we will send your results within 3 business days of [place] receiving them.  If you do not have mychart- we will call you about results within 5 business days of [place] receiving them.  *please also note that you will see labs on mychart as soon as they post. I will later go in and write notes on them- will say "notes from Dr. Durene Cal"   Recommended follow up: Return in about 6 months (around 02/27/2023) for followup or sooner if needed.Schedule b4 you leave.

## 2022-10-06 NOTE — Progress Notes (Addendum)
10/08/2022 Mark Alvarado [id number] March 28, 1975  Referring provider: Shelva Majestic, MD Primary GI doctor: Dr. Lavon Paganini  ASSESSMENT AND PLAN:   Left lower quadrant abdominal pain with history of diverticulitis Suspicious for diverticulitis/colitis with history and physical exam, patient hemodynamically stable - get CBC, CMET -Will schedule for CT AB and pelvis with contrast to evaluate further -IBGARD daily, will give Bentyl as needed, heating pad and liquid diet. - Prescribed Cipro and Flagyl - ER precautions discussed with the patient -close follow up 2-3 months  Chronic constipation - Increase fiber/ water intake, decrease caffeine, increase activity level. -Will add on Miralax daily  Screening colon/family history of colon cancer four 4-7 mm polyps in the sigmoid, descending and ascending colon, diverticulosis in the sigmoid, descending, transverse, ascending colon and in the cecum and nonbleeding external and internal hemorrhoids. Pathology showed hyperplastic polyps and benign mucosa.  Repeat colonoscopy recommended in 10 years With family history of colon cancer, should be 5 year recall or 2027  ADDENDUM: - Saw [facility] 10/04, Dr. Dossie Der, for possible partial colectomy, suggested colonoscopy due to family history prior to surgery. Will schedule for colonoscopy with Dr. Lavon Paganini at [facility]. Last diverticulitis flare was in August.     Patient Care Team: Shelva Majestic, MD as PCP - General (Family Medicine) Rollene Rotunda, MD as Consulting Physician (Cardiology)  HISTORY OF PRESENT ILLNESS: 47 y.o. male with a past medical history of HTN, NSVT, GERD, history of DVT, diverticulosis, family history of colon cancer, and others listed below presents for evaluation of AB pain, concern for diverticulitis.   04/26/2020 colonoscopy for clinically significant diarrhea with four 4-7 mm polyps in the sigmoid, descending and ascending colon, diverticulosis in the sigmoid,  descending, transverse, ascending colon and in the cecum and nonbleeding external and internal hemorrhoids. Pathology showed hyperplastic polyps and benign mucosa.  Repeat colonoscopy recommended in 10 years versus 2027 Sister passed 2007 of colon cancer, MGM, M aunt with colon cancer.   Patient states since Thursday he feels he has similar symptoms to April of last year when he had diverticulitis. 06/04/2021 CT showed sigmoid diverticulitis no perforation or abscess.  He has feeling he needs to have BM but  He has had LLQ/suprapubic AB pain with worsening constipation, bloating.  Denies fever, chills, nausea, vomiting.   He denies blood thinner use.  He denies NSAID use.  He denies ETOH use.   He denies tobacco use.  He denies drug use.    He  reports that he has never smoked. He has never used smokeless tobacco. He reports current alcohol use. He reports that he does not use drugs.  RELEVANT LABS AND IMAGING: CBC    Component Value Date/Time   WBC 7.3 08/27/2022 0849   RBC 5.12 08/27/2022 0849   HGB 13.9 08/27/2022 0849   HCT 43.4 08/27/2022 0849   PLT 246.0 08/27/2022 0849   MCV 84.7 08/27/2022 0849   MCH 27.6 06/03/2021 2320   MCHC 32.0 08/27/2022 0849   RDW 14.3 08/27/2022 0849   LYMPHSABS 2.2 08/27/2022 0849   MONOABS 0.5 08/27/2022 0849   EOSABS 0.1 08/27/2022 0849   BASOSABS 0.0 08/27/2022 0849   Recent Labs    11/07/21 1512 08/27/22 0849  HGB 13.8 13.9    CMP     Component Value Date/Time   NA 139 08/27/2022 0849   NA 141 01/07/2021 1606   K 4.3 08/27/2022 0849   CL 106 08/27/2022 0849   CO2 27 08/27/2022 0849  GLUCOSE 88 08/27/2022 0849   BUN 18 08/27/2022 0849   BUN 15 01/07/2021 1606   CREATININE 1.02 08/27/2022 0849   CALCIUM 9.4 08/27/2022 0849   PROT 7.4 08/27/2022 0849   ALBUMIN 4.3 08/27/2022 0849   AST 21 08/27/2022 0849   ALT 21 08/27/2022 0849   ALKPHOS 47 08/27/2022 0849   BILITOT 0.3 08/27/2022 0849   GFRNONAA >60 06/03/2021 2320    GFRAA 87 10/27/2018 0000      Latest Ref Rng & Units 08/27/2022    8:49 AM 11/07/2021    3:12 PM 06/03/2021   11:20 PM  Hepatic Function  Total Protein 6.0 - 8.3 g/dL 7.4  8.1  8.3   Albumin 3.5 - 5.2 g/dL 4.3  4.4  4.7   AST 0 - 37 U/L 21  27  21    ALT 0 - 53 U/L 21  19  17    Alk Phosphatase 39 - 117 U/L 47  56  42   Total Bilirubin 0.2 - 1.2 mg/dL 0.3  0.7  0.8       Current Medications:        Current Outpatient Medications (Other):    ciprofloxacin (CIPRO) 500 MG tablet, Take 1 tablet (500 mg total) by mouth 2 (two) times daily.   dicyclomine (BENTYL) 20 MG tablet, Take 1 tablet (20 mg total) by mouth every 8 (eight) hours as needed for spasms (AB pain).   metroNIDAZOLE (FLAGYL) 500 MG tablet, Take 1 tablet (500 mg total) by mouth 3 (three) times daily.   MULTIPLE VITAMIN PO, Take 1 tablet by mouth daily.   polyethylene glycol powder (GLYCOLAX/MIRALAX) 17 GM/SCOOP powder, Take 17 g by mouth daily.   Red Yeast Rice Extract (RED YEAST RICE PO), Take 2 capsules by mouth daily.   Wheat Dextrin (BENEFIBER PO), Take 2 tablets by mouth 2 (two) times daily. Gummies  Medical History:  Past Medical History:  Diagnosis Date   Allergy    Asthma    As a child   Chronic constipation    Diverticulitis    Diverticulosis    DVT (deep venous thrombosis) (HCC)    Hyperlipidemia    Hypertension    Lactose intolerance    Allergies:  Allergies  Allergen Reactions   Bee Pollen     Other Reaction(s): Cough   Dust Mite Extract    Grass Extracts [Gramineae Pollens]    Pollen Extract      Surgical History:  He  has a past surgical history that includes Rotator cuff repair (Right); Rotator cuff repair (Left); left knee arthroscopic; and Colonoscopy. Family History:  His family history includes Alzheimer's disease in his paternal grandmother; COPD in his father; Cancer in his sister; Colon cancer in his maternal aunt, maternal grandmother, paternal uncle, and sister; Healthy in his  sister; Heart attack in his paternal grandfather; High Cholesterol in his father and mother; Hypertension in his father and mother; Kidney disease in his father; Other in his maternal grandfather.  REVIEW OF SYSTEMS  : All other systems reviewed and negative except where noted in the History of Present Illness.  PHYSICAL EXAM: BP 118/88 (BP Location: Left Arm, Patient Position: Sitting, Cuff Size: Large)   Pulse 80   Ht 6' (1.829 m)   Wt 260 lb 2 oz (118 kg)   BMI 35.28 kg/m  General Appearance: Well nourished, in no apparent distress. Head:   Normocephalic and atraumatic. Eyes:  sclerae anicteric,conjunctive pink  Respiratory: Respiratory effort normal, BS equal bilaterally  without rales, rhonchi, wheezing. Cardio: RRR with no MRGs. Peripheral pulses intact.  Abdomen: Soft, obese AB, normal skin exam, active bowel sounds, tenderness LLQ with guarding without rebound, no organomegaly appreciated.  Rectal: Not evaluated Musculoskeletal: Full ROM, Normal gait. Without edema. Skin:  Dry and intact without significant lesions or rashes Neuro: Alert and  oriented x4;  No focal deficits. Psych:  Cooperative. Normal mood and affect.    Doree Albee, PA-C 9:23 AM

## 2022-10-08 ENCOUNTER — Ambulatory Visit: Payer: BC Managed Care – PPO | Admitting: Physician Assistant

## 2022-10-08 ENCOUNTER — Encounter: Payer: Self-pay | Admitting: Physician Assistant

## 2022-10-08 ENCOUNTER — Other Ambulatory Visit: Payer: BC Managed Care – PPO

## 2022-10-08 VITALS — BP 118/88 | HR 80 | Ht 72.0 in | Wt 260.1 lb

## 2022-10-08 DIAGNOSIS — Z800 Family history of malignant neoplasm of digestive organs: Secondary | ICD-10-CM | POA: Diagnosis not present

## 2022-10-08 DIAGNOSIS — R1032 Left lower quadrant pain: Secondary | ICD-10-CM | POA: Diagnosis not present

## 2022-10-08 DIAGNOSIS — Z8719 Personal history of other diseases of the digestive system: Secondary | ICD-10-CM

## 2022-10-08 DIAGNOSIS — K5909 Other constipation: Secondary | ICD-10-CM

## 2022-10-08 LAB — CBC WITH DIFFERENTIAL/PLATELET
Basophils Absolute: 0.1 10*3/uL (ref 0.0–0.1)
Basophils Relative: 0.8 % (ref 0.0–3.0)
Eosinophils Absolute: 0.3 10*3/uL (ref 0.0–0.7)
Eosinophils Relative: 3.1 % (ref 0.0–5.0)
HCT: 43.8 % (ref 39.0–52.0)
Hemoglobin: 14 g/dL (ref 13.0–17.0)
Lymphocytes Relative: 24.2 % (ref 12.0–46.0)
Lymphs Abs: 2.5 10*3/uL (ref 0.7–4.0)
MCHC: 32 g/dL (ref 30.0–36.0)
MCV: 84.8 fl (ref 78.0–100.0)
Monocytes Absolute: 0.6 10*3/uL (ref 0.1–1.0)
Monocytes Relative: 6.2 % (ref 3.0–12.0)
Neutro Abs: 6.7 10*3/uL (ref 1.4–7.7)
Neutrophils Relative %: 65.7 % (ref 43.0–77.0)
Platelets: 282 10*3/uL (ref 150.0–400.0)
RBC: 5.17 Mil/uL (ref 4.22–5.81)
RDW: 13.5 % (ref 11.5–15.5)
WBC: 10.2 10*3/uL (ref 4.0–10.5)

## 2022-10-08 LAB — COMPREHENSIVE METABOLIC PANEL
ALT: 18 U/L (ref 0–53)
AST: 21 U/L (ref 0–37)
Albumin: 4.5 g/dL (ref 3.5–5.2)
Alkaline Phosphatase: 53 U/L (ref 39–117)
BUN: 16 mg/dL (ref 6–23)
CO2: 29 mEq/L (ref 19–32)
Calcium: 9.6 mg/dL (ref 8.4–10.5)
Chloride: 102 mEq/L (ref 96–112)
Creatinine, Ser: 1.05 mg/dL (ref 0.40–1.50)
GFR: 83.4 mL/min (ref >60.00)
Glucose, Bld: 80 mg/dL (ref 70–99)
Potassium: 4.3 mEq/L (ref 3.5–5.1)
Sodium: 138 mEq/L (ref 135–145)
Total Bilirubin: 0.5 mg/dL (ref 0.2–1.2)
Total Protein: 8.3 g/dL (ref 6.0–8.3)

## 2022-10-08 MED ORDER — CIPROFLOXACIN HCL 500 MG PO TABS: 500.0000 mg | ORAL_TABLET | Freq: Two times a day (BID) | ORAL | 0 refills | Status: DC

## 2022-10-08 MED ORDER — METRONIDAZOLE 500 MG PO TABS
500.0000 mg | ORAL_TABLET | Freq: Three times a day (TID) | ORAL | 0 refills | Status: DC
Start: 2022-10-08 — End: 2022-11-17

## 2022-10-08 MED ORDER — DICYCLOMINE HCL 20 MG PO TABS: 20.0000 mg | ORAL_TABLET | Freq: Three times a day (TID) | ORAL | 0 refills | Status: DC | PRN

## 2022-10-08 NOTE — Patient Instructions (Addendum)
Your provider has requested that you go to the basement level for lab work before leaving today. Press "B" on the elevator. The lab is located at the first door on the left as you exit the elevator.   Will give Cipro and Flagyl Set up CT AB and pelvis with contrast Pick up the contrast today if you like. Drink one bottle at 8 am on 10/15/2022 and the second bottle at 9:00am. No solid food 4 hours prior to your appointment. [facility] is located at [address] Phone # is [phone].  Add on fiber supplement like BENEFIBER 1-2 x a day Can take dicyclomine at least 1-2 x a day for pain if needed.   Can do heating pad and can take tylenol max of 3000mg  a day.  Can add on lidocaine patches or voltern gel Go to the ER if unable to pass gas, severe AB pain, unable to hold down food, any shortness of breath of chest pain.   Diverticulitis Diverticulitis is inflammation or infection of small pouches in your colon that form when you have a condition called diverticulosis. The pouches in your colon are called diverticula. Your colon, or large intestine, is where water is absorbed and stool is formed. Complications of diverticulitis can include: Bleeding. Severe infection. Severe pain. Perforation of your colon. Obstruction of your colon.  What are the causes? Diverticulitis is caused by bacteria. Diverticulitis happens when stool becomes trapped in diverticula. This allows bacteria to grow in the diverticula, which can lead to inflammation and infection. What increases the risk? People with diverticulosis are at risk for diverticulitis. Eating a diet that does not include enough fiber from fruits and vegetables may make diverticulitis more likely to develop. What are the signs or symptoms? Symptoms of diverticulitis may include: Abdominal pain and tenderness. The pain is normally located on the left side of the abdomen, but may occur in other areas. Fever and  chills. Bloating. Cramping. Nausea. Vomiting. Constipation. Diarrhea. Blood in your stool.  How is this diagnosed? Your health care provider will ask you about your medical history and do a physical exam. You may need to have tests done because many medical conditions can cause the same symptoms as diverticulitis. Tests may include: Blood tests. Urine tests. Imaging tests of the abdomen, including X-rays and CT scans.  When your condition is under control, your health care provider may recommend that you have a colonoscopy. A colonoscopy can show how severe your diverticula are and whether something else is causing your symptoms. How is this treated? Most cases of diverticulitis are mild and can be treated at home. Treatment may include: Taking over-the-counter pain medicines. Following a clear liquid diet. Taking antibiotic medicines by mouth for 7-10 days.  More severe cases may be treated at a hospital. Treatment may include: Not eating or drinking. Taking prescription pain medicine. Receiving antibiotic medicines through an IV tube. Receiving fluids and nutrition through an IV tube. Surgery.  Follow these instructions at home: Follow your health care provider's instructions carefully. Follow a full liquid diet or other diet as directed by your health care provider. After your symptoms improve, your health care provider may tell you to change your diet. He or she may recommend you eat a high-fiber diet. Fruits and vegetables are good sources of fiber. Fiber makes it easier to pass stool. Take fiber supplements or probiotics as directed by your health care provider. Only take medicines as directed by your health care provider. Keep all your  follow-up appointments. Contact a health care provider if: Your pain does not improve. You have a hard time eating food. Your bowel movements do not return to normal. Get help right away if: Your pain becomes worse. Your symptoms do not  get better. Your symptoms suddenly get worse. You have a fever. You have repeated vomiting. You have bloody or black, tarry stools. This information is not intended to replace advice given to you by your health care provider. Make sure you discuss any questions you have with your health care provider. Document Released: 11/20/2004 Document Revised: 07/19/2015 Document Reviewed: 01/05/2013 Elsevier Interactive Patient Education  2017 Elsevier Inc.   Diverticulosis Diverticulosis is a condition that develops when small pouches (diverticula) form in the wall of the large intestine (colon). The colon is where water is absorbed and stool (feces) is formed. The pouches form when the inside layer of the colon pushes through weak spots in the outer layers of the colon. You may have a few pouches or many of them. The pouches usually do not cause problems unless they become inflamed or infected. When this happens, the condition is called diverticulitis- this is left lower quadrant pain, diarrhea, fever, chills, nausea or vomiting.  If this occurs please call the office or go to the hospital. Sometimes these patches without inflammation can also have painless bleeding associated with them, if this happens please call the office or go to the hospital. Preventing constipation and increasing fiber can help reduce diverticula and prevent complications. Even if you feel you have a high-fiber diet, suggest getting on Benefiber or Cirtracel 2 times daily.  Miralax is an osmotic laxative.  It only brings more water into the stool.  This is safe to take daily.  Can take up to 17 gram of miralax twice a day.  Mix with juice or coffee.  Start 1 capful at night for 3-4 days and reassess your response in 3-4 days.  You can increase and decrease the dose based on your response.  Remember, it can take up to 3-4 days to take effect OR for the effects to wear off.   I often pair this with benefiber in the morning to  help assure the stool is not too loose.   Toileting tips to help with your constipation - Drink at least 64-80 ounces of water/liquid per day. - Establish a time to try to move your bowels every day.  For many people, this is after a cup of coffee or after a meal such as breakfast. - Sit all of the way back on the toilet keeping your back fairly straight and while sitting up, try to rest the tops of your forearms on your upper thighs.   - Raising your feet with a step stool/squatty potty can be helpful to improve the angle that allows your stool to pass through the rectum. - Relax the rectum feeling it bulge toward the toilet water.  If you feel your rectum raising toward your body, you are contracting rather than relaxing. - Breathe in and slowly exhale. "Belly breath" by expanding your belly towards your belly button. Keep belly expanded as you gently direct pressure down and back to the anus.  A low pitched GRRR sound can assist with increasing intra-abdominal pressure.  (Can also trying to blow on a pinwheel and make it move, this helps with the same belly breathing) - Repeat 3-4 times. If unsuccessful, contract the pelvic floor to restore normal tone and get off the toilet.  Avoid excessive straining. - To reduce excessive wiping by teaching your anus to normally contract, place hands on outer aspect of knees and resist knee movement outward.  Hold 5-10 second then place hands just inside of knees and resist inward movement of knees.  Hold 5 seconds.  Repeat a few times each way.  Go to the ER if unable to pass gas, severe AB pain, unable to hold down food, any shortness of breath of chest pain.

## 2022-10-12 ENCOUNTER — Other Ambulatory Visit: Payer: Self-pay

## 2022-10-12 ENCOUNTER — Emergency Department (HOSPITAL_BASED_OUTPATIENT_CLINIC_OR_DEPARTMENT_OTHER): Payer: BC Managed Care – PPO

## 2022-10-12 ENCOUNTER — Emergency Department (HOSPITAL_BASED_OUTPATIENT_CLINIC_OR_DEPARTMENT_OTHER)
Admission: EM | Admit: 2022-10-12 | Discharge: 2022-10-12 | Disposition: A | Discharge status: Home / Self Care | Payer: BC Managed Care – PPO | Attending: Emergency Medicine | Admitting: Emergency Medicine

## 2022-10-12 ENCOUNTER — Encounter (HOSPITAL_BASED_OUTPATIENT_CLINIC_OR_DEPARTMENT_OTHER): Payer: Self-pay

## 2022-10-12 DIAGNOSIS — D72829 Elevated white blood cell count, unspecified: Secondary | ICD-10-CM | POA: Insufficient documentation

## 2022-10-12 DIAGNOSIS — R1030 Lower abdominal pain, unspecified: Secondary | ICD-10-CM | POA: Diagnosis not present

## 2022-10-12 DIAGNOSIS — K5792 Diverticulitis of intestine, part unspecified, without perforation or abscess without bleeding: Secondary | ICD-10-CM

## 2022-10-12 DIAGNOSIS — K5732 Diverticulitis of large intestine without perforation or abscess without bleeding: Secondary | ICD-10-CM | POA: Insufficient documentation

## 2022-10-12 DIAGNOSIS — R1032 Left lower quadrant pain: Secondary | ICD-10-CM | POA: Diagnosis not present

## 2022-10-12 DIAGNOSIS — I10 Essential (primary) hypertension: Secondary | ICD-10-CM | POA: Diagnosis not present

## 2022-10-12 LAB — COMPREHENSIVE METABOLIC PANEL
ALT: 22 U/L (ref 0–44)
AST: 19 U/L (ref 15–41)
Albumin: 4.3 g/dL (ref 3.5–5.0)
Alkaline Phosphatase: 52 U/L (ref 38–126)
Anion gap: 9 (ref 5–15)
BUN: 18 mg/dL (ref 6–20)
CO2: 27 mmol/L (ref 22–32)
Calcium: 9.3 mg/dL (ref 8.9–10.3)
Chloride: 103 mmol/L (ref 98–111)
Creatinine, Ser: 1.21 mg/dL (ref 0.61–1.24)
GFR, Estimated: >60 mL/min (ref >60)
Glucose, Bld: 94 mg/dL (ref 70–99)
Potassium: 3.9 mmol/L (ref 3.5–5.1)
Sodium: 139 mmol/L (ref 135–145)
Total Bilirubin: 0.4 mg/dL (ref 0.3–1.2)
Total Protein: 8.2 g/dL — ABNORMAL HIGH (ref 6.5–8.1)

## 2022-10-12 LAB — CBC WITH DIFFERENTIAL/PLATELET
Abs Immature Granulocytes: 0.04 10*3/uL (ref 0.00–0.07)
Basophils Absolute: 0.1 10*3/uL (ref 0.0–0.1)
Basophils Relative: 1 %
Eosinophils Absolute: 0.2 10*3/uL (ref 0.0–0.5)
Eosinophils Relative: 2 %
HCT: 42.7 % (ref 39.0–52.0)
Hemoglobin: 14 g/dL (ref 13.0–17.0)
Immature Granulocytes: 0 %
Lymphocytes Relative: 21 %
Lymphs Abs: 2.4 10*3/uL (ref 0.7–4.0)
MCH: 27.2 pg (ref 26.0–34.0)
MCHC: 32.8 g/dL (ref 30.0–36.0)
MCV: 82.9 fL (ref 80.0–100.0)
Monocytes Absolute: 0.8 10*3/uL (ref 0.1–1.0)
Monocytes Relative: 7 %
Neutro Abs: 8.2 10*3/uL — ABNORMAL HIGH (ref 1.7–7.7)
Neutrophils Relative %: 69 %
Platelets: 300 10*3/uL (ref 150–400)
RBC: 5.15 MIL/uL (ref 4.22–5.81)
RDW: 13 % (ref 11.5–15.5)
WBC: 11.8 10*3/uL — ABNORMAL HIGH (ref 4.0–10.5)
nRBC: 0 % (ref 0.0–0.2)

## 2022-10-12 LAB — LIPASE, BLOOD: Lipase: 13 U/L (ref 11–51)

## 2022-10-12 MED ORDER — MORPHINE SULFATE (PF) 4 MG/ML IV SOLN
4.0000 mg | Freq: Once | INTRAVENOUS | Status: AC
  Administered 2022-10-12: 4 mg via INTRAVENOUS
  Filled 2022-10-12: qty 1

## 2022-10-12 MED ORDER — HYDROCODONE-ACETAMINOPHEN 5-325 MG PO TABS: 2.0000 | ORAL_TABLET | ORAL | 0 refills | Status: DC | PRN

## 2022-10-12 MED ORDER — KETOROLAC TROMETHAMINE 15 MG/ML IJ SOLN
15.0000 mg | Freq: Once | INTRAMUSCULAR | Status: AC
  Administered 2022-10-12: 15 mg via INTRAVENOUS
  Filled 2022-10-12: qty 1

## 2022-10-12 MED ORDER — IOHEXOL 300 MG/ML  SOLN
100.0000 mL | Freq: Once | INTRAMUSCULAR | Status: AC | PRN
  Administered 2022-10-12: 100 mL via INTRAVENOUS

## 2022-10-12 MED ORDER — MECLIZINE HCL 25 MG PO TABS: 25.0000 mg | ORAL_TABLET | Freq: Three times a day (TID) | ORAL | 0 refills | Status: DC | PRN

## 2022-10-12 MED ORDER — METHYLPREDNISOLONE 4 MG PO TBPK: ORAL_TABLET | ORAL | 0 refills | Status: DC

## 2022-10-12 NOTE — ED Notes (Signed)
Pt unable to void at this time. 

## 2022-10-12 NOTE — Discharge Instructions (Addendum)
You were seen for diverticulitis in the emergency department.   At home, please take the antibiotics you were prescribed. Start with a clear liquid diet (coffee, tea, sports drinks, broths, etc.) and advance as tolerated to solid foods over the next few days.    Check your MyChart online for the results of any tests that had not resulted by the time you left the emergency department.   Follow-up with your primary doctor in 2-3 days regarding your visit.  Follow-up with the gastroenterologist to discuss if you need a colonoscopy in 6 weeks. If you have had more than 3 occurrences you may need to also talk to the general surgeons to see if you need an operation.   Return immediately to the emergency department if you experience any of the following: severe abdominal pain, high fevers, or any other concerning symptoms.    Thank you for visiting our Emergency Department. It was a pleasure taking care of you today.

## 2022-10-12 NOTE — ED Triage Notes (Signed)
Pt POV from home states he has not had a BM since 8/8 and c/o abd discomfort. Pt saw GI last week and has a CT scheduled for this Wednesday but was unable to sleep last night due to abd pain.

## 2022-10-12 NOTE — ED Provider Notes (Addendum)
[place] EMERGENCY DEPARTMENT AT [facility] Provider Note   CSN: [id number] Arrival date & time: 10/12/22  7829     History  Chief Complaint  Patient presents with   Abdominal Pain    Mark Alvarado is a 47 y.o. male.  47 year old male with a history of hypertension, hyperlipidemia, and diverticulitis who presents to the emergency department with lower abdominal pain.  Says that for over a week has been having lower abdominal pain.  Says he is also been constipated since 8/8 and has only been having small volume stools since then.  No nausea or vomiting.  No fevers, dysuria, or frequency.  Has been taking Cipro, Flagyl, and dicyclomine but reports no change or improvement of his symptoms.  Is supposed to have an outpatient CT on Wednesday but presented because his pain was worsening.       Home Medications Prior to Admission medications   Medication Sig Start Date End Date Taking? Authorizing Provider  HYDROcodone-acetaminophen (NORCO/VICODIN) 5-325 MG tablet Take 2 tablets by mouth every 4 (four) hours as needed. 10/12/22  Yes Rondel Baton, MD  ciprofloxacin (CIPRO) 500 MG tablet Take 1 tablet (500 mg total) by mouth 2 (two) times daily. 10/08/22   Doree Albee, PA-C  dicyclomine (BENTYL) 20 MG tablet Take 1 tablet (20 mg total) by mouth every 8 (eight) hours as needed for spasms (AB pain). 10/08/22   Doree Albee, PA-C  metroNIDAZOLE (FLAGYL) 500 MG tablet Take 1 tablet (500 mg total) by mouth 3 (three) times daily. 10/08/22   Doree Albee, PA-C  MULTIPLE VITAMIN PO Take 1 tablet by mouth daily.    [provider]  polyethylene glycol powder (GLYCOLAX/MIRALAX) 17 GM/SCOOP powder Take 17 g by mouth daily.    [provider]  Red Yeast Rice Extract (RED YEAST RICE PO) Take 2 capsules by mouth daily.    [provider]  Wheat Dextrin (BENEFIBER PO) Take 2 tablets by mouth 2 (two) times daily. Gummies    [provider]       Allergies    Bee pollen, Dust mite extract, Grass extracts [gramineae pollens], and Pollen extract    Review of Systems   Review of Systems  Physical Exam Updated Vital Signs BP 119/89   Pulse 89   Temp 98.2 F (36.8 C) (Oral)   Resp 18   Ht 6' (1.829 m)   Wt 117.9 kg   SpO2 96%   BMI 35.26 kg/m  Physical Exam Vitals and nursing note reviewed.  Constitutional:      General: He is not in acute distress.    Appearance: He is well-developed.  HENT:     Head: Normocephalic and atraumatic.     Right Ear: External ear normal.     Left Ear: External ear normal.     Nose: Nose normal.  Eyes:     Extraocular Movements: Extraocular movements intact.     Conjunctiva/sclera: Conjunctivae normal.     Pupils: Pupils are equal, round, and reactive to light.  Cardiovascular:     Rate and Rhythm: Normal rate and regular rhythm.  Pulmonary:     Effort: Pulmonary effort is normal.  Abdominal:     General: There is no distension.     Palpations: Abdomen is soft. There is no mass.     Tenderness: There is abdominal tenderness (Periumbilical and suprapubic). There is no guarding.  Musculoskeletal:     Cervical back: Normal range of motion and  neck supple.  Skin:    General: Skin is warm and dry.  Neurological:     Mental Status: He is alert. Mental status is at baseline.  Psychiatric:        Mood and Affect: Mood normal.        Behavior: Behavior normal.     ED Results / Procedures / Treatments   Labs (all labs ordered are listed, but only abnormal results are displayed) Labs Reviewed  COMPREHENSIVE METABOLIC PANEL - Abnormal; Notable for the following components:      Result Value   Total Protein 8.2 (*)    All other components within normal limits  CBC WITH DIFFERENTIAL/PLATELET - Abnormal; Notable for the following components:   WBC 11.8 (*)    Neutro Abs 8.2 (*)    All other components within normal limits  LIPASE, BLOOD    EKG None  Radiology CT ABDOMEN  PELVIS W CONTRAST  Result Date: 10/12/2022 CLINICAL DATA:  Left lower quadrant abdominal pain. Patient has not had a BM since 08/08. Complains of abdominal discomfort. EXAM: CT ABDOMEN AND PELVIS WITH CONTRAST TECHNIQUE: Multidetector CT imaging of the abdomen and pelvis was performed using the standard protocol following bolus administration of intravenous contrast. RADIATION DOSE REDUCTION: This exam was performed according to the departmental dose-optimization program which includes automated exposure control, adjustment of the mA and/or kV according to patient size and/or use of iterative reconstruction technique. CONTRAST:  OMNIPAQUE IOHEXOL 300 MG/ML  SOLN COMPARISON:  06/04/2021 FINDINGS: Lower chest: No acute abnormality. Hepatobiliary: This is been unchanged low-density structure along the dome of left hepatic lobe measuring 1.2 cm. This has been stable since 05/23/2017 compatible with a benign abnormality. Gallbladder appears normal. No bile duct dilatation. Pancreas: Unremarkable. No pancreatic ductal dilatation or surrounding inflammatory changes. Spleen: Normal in size without focal abnormality. Adrenals/Urinary Tract: Normal adrenal glands. No nephrolithiasis, hydronephrosis or mass. Urinary bladder is unremarkable. Stomach/Bowel: Stomach appears normal. The appendix is visualized and is unremarkable. No pathologic dilatation of the large or small bowel loops. Sigmoid diverticulosis with acute sigmoid diverticulitis involving the distal sigmoid colon. Wall thickening with surrounding inflammatory changes identified. Vascular/Lymphatic: No significant vascular findings are present. No enlarged abdominal or pelvic lymph nodes. Reproductive: Prostate is unremarkable. Other: Trace free fluid within the dependent pelvis. No discrete fluid collections. Musculoskeletal: No acute or significant osseous findings. IMPRESSION: 1. Acute sigmoid diverticulitis. No evidence for abscess or perforation. 2.  Trace free fluid within the dependent pelvis. Electronically Signed   By: Signa Kell M.D.   On: 10/12/2022 09:17    Procedures Procedures    Medications Ordered in ED Medications  iohexol (OMNIPAQUE) 300 MG/ML solution 100 mL (100 mLs Intravenous Contrast Given 10/12/22 0850)  ketorolac (TORADOL) 15 MG/ML injection 15 mg (15 mg Intravenous Given 10/12/22 0859)  morphine (PF) 4 MG/ML injection 4 mg (4 mg Intravenous Given 10/12/22 0859)    ED Course/ Medical Decision Making/ A&P                                 Medical Decision Making Amount and/or Complexity of Data Reviewed Labs: ordered. Radiology: ordered.  Risk Prescription drug management.   Mark Alvarado is a 47 y.o. male with comorbidities that complicate the patient evaluation including diverticulitis who presents to the emergency department suprapubic pain and diarrhea  Initial Ddx:  Diverticulitis, colitis, UTI, sepsis  MDM:  Based on the patient's symptoms  feel that he likely have diverticulitis.  Will obtain CT scan which will show if they have colitis which could also be causing similar symptoms.  No urinary symptoms to suggest UTI.   No signs of sepsis at this time  Plan:  Labs Urinalysis CT abdomen pelvis IV contrast Pain medication  ED Summary/Re-evaluation:  Patient was found to have uncomplicated diverticulitis on CT.  Will have him complete his course of antibiotics and to start a clear liquid diet and to advance as tolerated.  Given Norco rx for any breakthrough pain.  Instructed to follow-up with her primary doctor in several days and GI in 6 weeks to see if colonoscopy or any other interventions are required.  This patient presents to the ED for concern of complaints listed in HPI, this involves an extensive number of treatment options, and is a complaint that carries with it a high risk of complications and morbidity. Disposition including potential need for admission considered.   Dispo: DC Home.  Return precautions discussed including, but not limited to, those listed in the AVS. Allowed pt time to ask questions which were answered fully prior to dc.  Records reviewed Outpatient Clinic Notes The following labs were independently interpreted: CBC and show  mild leukocytosis suggesting infection I independently reviewed the following imaging with scope of interpretation limited to determining acute life threatening conditions related to emergency care: CT Abdomen/Pelvis and agree with the radiologist interpretation with the following exceptions: none I personally reviewed and interpreted cardiac monitoring: normal sinus rhythm  I personally reviewed and interpreted the pt's EKG: see above for interpretation  I have reviewed the patients home medications and made adjustments as needed   Final Clinical Impression(s) / ED Diagnoses Final diagnoses:  Diverticulitis    Rx / DC Orders ED Discharge Orders          Ordered    methylPREDNISolone (MEDROL DOSEPAK) 4 MG TBPK tablet  Status:  Discontinued        10/12/22 0813    meclizine (ANTIVERT) 25 MG tablet  3 times daily PRN,   Status:  Discontinued        10/12/22 0813    HYDROcodone-acetaminophen (NORCO/VICODIN) 5-325 MG tablet  Every 4 hours PRN        10/12/22 0927              Rondel Baton, MD 10/12/22 1008    Rondel Baton, MD 10/12/22 1008

## 2022-10-13 DIAGNOSIS — R1032 Left lower quadrant pain: Secondary | ICD-10-CM

## 2022-10-13 DIAGNOSIS — Z8719 Personal history of other diseases of the digestive system: Secondary | ICD-10-CM

## 2022-10-15 ENCOUNTER — Other Ambulatory Visit: Payer: BC Managed Care – PPO

## 2022-11-17 MED ORDER — AMOXICILLIN-POT CLAVULANATE 875-125 MG PO TABS: 1.0000 | ORAL_TABLET | Freq: Two times a day (BID) | ORAL | 0 refills | Status: DC

## 2022-11-17 MED ORDER — DICYCLOMINE HCL 20 MG PO TABS: 20.0000 mg | ORAL_TABLET | Freq: Three times a day (TID) | ORAL | 0 refills | Status: DC | PRN

## 2022-11-17 NOTE — Telephone Encounter (Signed)
Pt came by office requesting to speak to a nurse about the general surgery  referral that Quentin Mulling PA had mentioned to pt. Pt stated that he starting having the Lower Mid /left Abdominal pain on Thursday 8 out 10, Feels like a knife/throbbing pain. Recent history of diverticulitis.  Pt stated that he had 2 pills of Cipro left. Still taking the benefiber, miralax. Pt stated that he did not take the prednisone pack that was prescribed due to a history of him gaining weight when taking the medication before. Pt concerned about family hx of Colon Cancer. Pt questions if he needed a Colonoscopy. Please review and advise.

## 2022-11-17 NOTE — Addendum Note (Signed)
Addended by: Quentin Mulling on: 11/17/2022 10:32 AM   Modules accepted: Orders

## 2022-11-17 NOTE — Telephone Encounter (Signed)
Pt made aware of Quentin Mulling PA recommendations. With unremarkable colonoscopy 2022 we do not need to repeat colonoscopy until he is due.  Pt notified prescription has been sent to pharmacy for the Augmentin and Dicyclomine, Continue MiraLAX and stop benifiber.  Referral was sent to [facility] General surgery along with pt records  for the recurrent diverticulitis  Advised to go to the ER if there is any severe abdominal pain, unable to hold down food/water, blood in stool or vomit, chest pain, shortness of breath, or any worsening symptoms.  Pt notified that the provider is requesting labs. Location to lab provided. Pt was offered follow up appointment  dates and pt stated that he could do 12/05/2022. Pt was notified that we would contact him on 11/28/2022 to schedule the appointment with Quentin Mulling PA.  Reminder placed in Epic.  Pt verbalized understanding with all questions answered.
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
## 2022-11-18 ENCOUNTER — Other Ambulatory Visit (INDEPENDENT_AMBULATORY_CARE_PROVIDER_SITE_OTHER): Payer: BC Managed Care – PPO

## 2022-11-18 ENCOUNTER — Telehealth: Payer: Self-pay | Admitting: Physician Assistant

## 2022-11-18 DIAGNOSIS — R1032 Left lower quadrant pain: Secondary | ICD-10-CM

## 2022-11-18 LAB — COMPREHENSIVE METABOLIC PANEL
ALT: 14 U/L (ref 0–53)
AST: 15 U/L (ref 0–37)
Albumin: 4.4 g/dL (ref 3.5–5.2)
Alkaline Phosphatase: 56 U/L (ref 39–117)
BUN: 14 mg/dL (ref 6–23)
CO2: 29 mEq/L (ref 19–32)
Calcium: 9.7 mg/dL (ref 8.4–10.5)
Chloride: 102 mEq/L (ref 96–112)
Creatinine, Ser: 1.07 mg/dL (ref 0.40–1.50)
GFR: 81.47 mL/min (ref >60.00)
Glucose, Bld: 90 mg/dL (ref 70–99)
Potassium: 4.4 mEq/L (ref 3.5–5.1)
Sodium: 139 mEq/L (ref 135–145)
Total Bilirubin: 0.6 mg/dL (ref 0.2–1.2)
Total Protein: 8.3 g/dL (ref 6.0–8.3)

## 2022-11-18 LAB — CBC WITH DIFFERENTIAL/PLATELET
Basophils Absolute: 0.1 10*3/uL (ref 0.0–0.1)
Basophils Relative: 0.6 % (ref 0.0–3.0)
Eosinophils Absolute: 0.1 10*3/uL (ref 0.0–0.7)
Eosinophils Relative: 1.4 % (ref 0.0–5.0)
HCT: 43.2 % (ref 39.0–52.0)
Hemoglobin: 13.8 g/dL (ref 13.0–17.0)
Lymphocytes Relative: 25.1 % (ref 12.0–46.0)
Lymphs Abs: 2.4 10*3/uL (ref 0.7–4.0)
MCHC: 31.9 g/dL (ref 30.0–36.0)
MCV: 85 fl (ref 78.0–100.0)
Monocytes Absolute: 0.5 10*3/uL (ref 0.1–1.0)
Monocytes Relative: 5.5 % (ref 3.0–12.0)
Neutro Abs: 6.3 10*3/uL (ref 1.4–7.7)
Neutrophils Relative %: 67.4 % (ref 43.0–77.0)
Platelets: 279 10*3/uL (ref 150.0–400.0)
RBC: 5.09 Mil/uL (ref 4.22–5.81)
RDW: 14.2 % (ref 11.5–15.5)
WBC: 9.4 10*3/uL (ref 4.0–10.5)

## 2022-11-18 LAB — SEDIMENTATION RATE: Sed Rate: 78 mm/hr — ABNORMAL HIGH (ref 0–15)

## 2022-11-18 NOTE — Telephone Encounter (Signed)
Inbound call from patient's wife requesting a call back to discuss results. Please advise, thank you.

## 2022-11-18 NOTE — Telephone Encounter (Signed)
PT is calling to have a nurse go over lab results. Please advise.

## 2022-11-18 NOTE — Telephone Encounter (Signed)
Tried to call number listed.  Left message and send my chart message with information.

## 2022-11-18 NOTE — Telephone Encounter (Signed)
Spoke with pt and he is requesting that Marchelle Folks call his wife at [phone] regarding his results. He states she wants to know what is causing the sed rate to be elevated if he does not have an infection. He explained to his wife that he has inflammation that is causing this but then she wants to know what is causing the inflammation.

## 2022-11-19 NOTE — Telephone Encounter (Signed)
Tried to call the new number given, left a message.

## 2022-11-19 NOTE — Telephone Encounter (Signed)
Tried to call again

## 2022-11-28 DIAGNOSIS — K5792 Diverticulitis of intestine, part unspecified, without perforation or abscess without bleeding: Secondary | ICD-10-CM | POA: Diagnosis not present

## 2022-11-28 NOTE — Telephone Encounter (Signed)
Left message for patient to call back to schedule appointment.

## 2022-12-04 NOTE — Progress Notes (Signed)
Pt scheduled for previsit 12/31/22 at 10am over the phone. Colon scheduled in the [facility] on 01/06/23 at 4pm with Dr. Lavon Paganini. Pt aware of appt.

## 2022-12-04 NOTE — Progress Notes (Signed)
12/05/2022 Mark Alvarado [id number] 1975-11-14  Referring provider: Shelva Majestic, MD Primary GI doctor: Dr. Lavon Paganini  ASSESSMENT AND PLAN:   Left lower quadrant abdominal pain with history of diverticulitis, now with rebound tenderness and possible UTI symptoms Suspicious for diverticulitis/colitis with history and physical exam, will get STAT CT with symptoms to rule out complications such as fistula/abscess - get CBC, CMET -Will schedule for CT AB and pelvis with contrast to evaluate further -IBGARD daily, will give Bentyl take before food, heating pad and liquid diet. -Finish Augmentin -Will schedule for repeat colon due to family history and potential upcoming partial colectomy for recurrent diverticulitis - ER precautions discussed with the patient  Chronic constipation - Increase fiber/ water intake, decrease caffeine, increase activity level. -Will add on Miralax daily  Screening colon/family history of colon cancer four 4-7 mm polyps in the sigmoid, descending and ascending colon, diverticulosis in the sigmoid, descending, transverse, ascending colon and in the cecum and nonbleeding external and internal hemorrhoids. Pathology showed hyperplastic polyps and benign mucosa.  Repeat colonoscopy recommended in 10 years Scheduled for colonoscopy 11/12 at [facility] pending CT We have discussed the risks of bleeding, infection, perforation, medication reactions, and remote risk of death associated with colonoscopy. All questions were answered and the patient acknowledges these risk and wishes to proceed.   Patient Care Team: Shelva Majestic, MD as PCP - General (Family Medicine) Rollene Rotunda, MD as Consulting Physician (Cardiology)  HISTORY OF PRESENT ILLNESS: 47 y.o. male with a past medical history of HTN, NSVT, GERD, history of DVT, diverticulosis, family history of colon cancer, and others listed below presents for evaluation of AB pain, concern for diverticulitis.    04/26/2020 colonoscopy for clinically significant diarrhea with four 4-7 mm polyps in the sigmoid, descending and ascending colon, diverticulosis in the sigmoid, descending, transverse, ascending colon and in the cecum and nonbleeding external and internal hemorrhoids. Pathology showed hyperplastic polyps and benign mucosa.  Repeat colonoscopy recommended in 10 years versus 2027 Sister passed 2007 of colon cancer, MGM, M aunt with colon cancer.   06/04/2021 CT showed sigmoid diverticulitis no perforation or abscess.  10/12/2022  Acute sigmoid diverticulitis. No evidence for abscess or perforation. Given Cipro/flagyl 08/04 11/17/22 called with continuing pain, started on Augmentin, has a few pills left 11/28/22 [facility] Dr. Dossie Der, for possible partial colectomy, suggested colonoscopy due to family history prior to surgery.  If he is sitting at work, he will feel suprapubic pressure, will have to urinate every hour and this will relief the pressure, has had some hesitancy.  Denies air in the urine.  He has had constipation and having a difficult time with a bowel movement, small balls stools with bloating, will attempt to have BM 3-4 x a day, sometimes has stools but at times just gas. He has had decreased appetite due to worsening stools/discomfort.   He is on dicyclomine.  Denies fever, chills, nausea, vomiting.   He denies blood thinner use.  He denies NSAID use.  He denies ETOH use.   He denies tobacco use.  He denies drug use.    He  reports that he has never smoked. He has never used smokeless tobacco. He reports current alcohol use. He reports that he does not use drugs.  RELEVANT LABS AND IMAGING: CBC    Component Value Date/Time   WBC 9.4 11/18/2022 0923   RBC 5.09 11/18/2022 0923   HGB 13.8 11/18/2022 0923   HCT 43.2 11/18/2022 0923  PLT 279.0 11/18/2022 0923   MCV 85.0 11/18/2022 0923   MCH 27.2 10/12/2022 0753   MCHC 31.9 11/18/2022 0923   RDW 14.2 11/18/2022 0923    LYMPHSABS 2.4 11/18/2022 0923   MONOABS 0.5 11/18/2022 0923   EOSABS 0.1 11/18/2022 0923   BASOSABS 0.1 11/18/2022 0923   Recent Labs    08/27/22 0849 10/08/22 1027 10/12/22 0753 11/18/22 0923  HGB 13.9 14.0 14.0 13.8    CMP     Component Value Date/Time   NA 139 11/18/2022 0923   NA 141 01/07/2021 1606   K 4.4 11/18/2022 0923   CL 102 11/18/2022 0923   CO2 29 11/18/2022 0923   GLUCOSE 90 11/18/2022 0923   BUN 14 11/18/2022 0923   BUN 15 01/07/2021 1606   CREATININE 1.07 11/18/2022 0923   CALCIUM 9.7 11/18/2022 0923   PROT 8.3 11/18/2022 0923   ALBUMIN 4.4 11/18/2022 0923   AST 15 11/18/2022 0923   ALT 14 11/18/2022 0923   ALKPHOS 56 11/18/2022 0923   BILITOT 0.6 11/18/2022 0923   GFRNONAA >60 10/12/2022 0753   GFRAA 87 10/27/2018 0000      Latest Ref Rng & Units 11/18/2022    9:23 AM 10/12/2022    7:53 AM 10/08/2022   10:27 AM  Hepatic Function  Total Protein 6.0 - 8.3 g/dL 8.3  8.2  8.3   Albumin 3.5 - 5.2 g/dL 4.4  4.3  4.5   AST 0 - 37 U/L 15  19  21    ALT 0 - 53 U/L 14  22  18    Alk Phosphatase 39 - 117 U/L 56  52  53   Total Bilirubin 0.2 - 1.2 mg/dL 0.6  0.4  0.5       Current Medications:        Current Outpatient Medications (Other):    amoxicillin-clavulanate (AUGMENTIN) 875-125 MG tablet, Take 1 tablet by mouth 2 (two) times daily.   dicyclomine (BENTYL) 20 MG tablet, Take 1 tablet (20 mg total) by mouth every 8 (eight) hours as needed for spasms (AB pain).   MULTIPLE VITAMIN PO, Take 1 tablet by mouth daily. (Patient not taking: Reported on 12/05/2022)   polyethylene glycol powder (GLYCOLAX/MIRALAX) 17 GM/SCOOP powder, Take 17 g by mouth daily. (Patient not taking: Reported on 12/05/2022)   Red Yeast Rice Extract (RED YEAST RICE PO), Take 2 capsules by mouth daily. (Patient not taking: Reported on 12/05/2022)   Wheat Dextrin (BENEFIBER PO), Take 2 tablets by mouth 2 (two) times daily. Gummies (Patient not taking: Reported on  12/05/2022)  Medical History:  Past Medical History:  Diagnosis Date   Allergy    Asthma    As a child   Chronic constipation    Diverticulitis    Diverticulosis    DVT (deep venous thrombosis) (HCC)    Hyperlipidemia    Hypertension    Lactose intolerance    Allergies:  Allergies  Allergen Reactions   Bee Pollen     Other Reaction(s): Cough   Dust Mite Extract    Grass Extracts [Gramineae Pollens]    Hydrocodone-Acetaminophen Other (See Comments)    Lethargic, felt out of body   Pollen Extract      Surgical History:  He  has a past surgical history that includes Rotator cuff repair (Right); Rotator cuff repair (Left); left knee arthroscopic; and Colonoscopy. Family History:  His family history includes Alzheimer's disease in his paternal grandmother; COPD in his father; Cancer in his sister; Colon  cancer in his maternal aunt, maternal grandmother, paternal uncle, and sister; Healthy in his sister; Heart attack in his paternal grandfather; High Cholesterol in his father and mother; Hypertension in his father and mother; Kidney disease in his father; Other in his maternal grandfather.  REVIEW OF SYSTEMS  : All other systems reviewed and negative except where noted in the History of Present Illness.  PHYSICAL EXAM: BP 120/80 (BP Location: Left Arm, Patient Position: Sitting, Cuff Size: Normal)   Pulse 75   Ht 6' (1.829 m)   Wt 254 lb 6 oz (115.4 kg)   BMI 34.50 kg/m  General Appearance: Well nourished, in no apparent distress. Head:   Normocephalic and atraumatic. Eyes:  sclerae anicteric,conjunctive pink  Respiratory: Respiratory effort normal, BS equal bilaterally without rales, rhonchi, wheezing. Cardio: RRR with no MRGs. Peripheral pulses intact.  Abdomen: Soft, obese AB, normal skin exam, active-hypoactive bowel sounds, tenderness RLQ/suprapubic with rebound Rectal: Not evaluated Musculoskeletal: Full ROM, Normal gait. Without edema. Skin:  Dry and intact without  significant lesions or rashes Neuro: Alert and  oriented x4;  No focal deficits. Psych:  Cooperative. Normal mood and affect.    Doree Albee, PA-C 9:32 AM

## 2022-12-05 ENCOUNTER — Other Ambulatory Visit: Payer: BC Managed Care – PPO

## 2022-12-05 ENCOUNTER — Ambulatory Visit: Payer: BC Managed Care – PPO | Admitting: Physician Assistant

## 2022-12-05 ENCOUNTER — Ambulatory Visit (HOSPITAL_COMMUNITY)
Admission: RE | Admit: 2022-12-05 | Discharge: 2022-12-05 | Disposition: A | Discharge status: Home / Self Care | Payer: BC Managed Care – PPO | Source: Ambulatory Visit | Attending: Physician Assistant | Admitting: Physician Assistant

## 2022-12-05 ENCOUNTER — Encounter: Payer: Self-pay | Admitting: Physician Assistant

## 2022-12-05 VITALS — BP 120/80 | HR 75 | Ht 72.0 in | Wt 254.4 lb

## 2022-12-05 DIAGNOSIS — R1032 Left lower quadrant pain: Secondary | ICD-10-CM

## 2022-12-05 DIAGNOSIS — R350 Frequency of micturition: Secondary | ICD-10-CM

## 2022-12-05 DIAGNOSIS — Z8719 Personal history of other diseases of the digestive system: Secondary | ICD-10-CM | POA: Diagnosis not present

## 2022-12-05 DIAGNOSIS — R10829 Rebound abdominal tenderness, unspecified site: Secondary | ICD-10-CM | POA: Diagnosis not present

## 2022-12-05 DIAGNOSIS — K5792 Diverticulitis of intestine, part unspecified, without perforation or abscess without bleeding: Secondary | ICD-10-CM | POA: Diagnosis not present

## 2022-12-05 DIAGNOSIS — Z800 Family history of malignant neoplasm of digestive organs: Secondary | ICD-10-CM

## 2022-12-05 DIAGNOSIS — R10823 Right lower quadrant rebound abdominal tenderness: Secondary | ICD-10-CM

## 2022-12-05 DIAGNOSIS — K5909 Other constipation: Secondary | ICD-10-CM | POA: Diagnosis not present

## 2022-12-05 DIAGNOSIS — K5730 Diverticulosis of large intestine without perforation or abscess without bleeding: Secondary | ICD-10-CM | POA: Diagnosis not present

## 2022-12-05 DIAGNOSIS — Z860102 Personal history of hyperplastic colon polyps: Secondary | ICD-10-CM

## 2022-12-05 LAB — COMPREHENSIVE METABOLIC PANEL
ALT: 15 U/L (ref 0–53)
AST: 15 U/L (ref 0–37)
Albumin: 4.3 g/dL (ref 3.5–5.2)
Alkaline Phosphatase: 52 U/L (ref 39–117)
BUN: 16 mg/dL (ref 6–23)
CO2: 29 meq/L (ref 19–32)
Calcium: 9.8 mg/dL (ref 8.4–10.5)
Chloride: 105 meq/L (ref 96–112)
Creatinine, Ser: 1.37 mg/dL (ref 0.40–1.50)
GFR: 60.54 mL/min (ref >60.00)
Glucose, Bld: 85 mg/dL (ref 70–99)
Potassium: 4.5 meq/L (ref 3.5–5.1)
Sodium: 142 meq/L (ref 135–145)
Total Bilirubin: 0.5 mg/dL (ref 0.2–1.2)
Total Protein: 7.2 g/dL (ref 6.0–8.3)

## 2022-12-05 LAB — CBC WITH DIFFERENTIAL/PLATELET
Basophils Absolute: 0 10*3/uL (ref 0.0–0.1)
Basophils Relative: 0.7 % (ref 0.0–3.0)
Eosinophils Absolute: 0.1 10*3/uL (ref 0.0–0.7)
Eosinophils Relative: 1.6 % (ref 0.0–5.0)
HCT: 41.9 % (ref 39.0–52.0)
Hemoglobin: 13.3 g/dL (ref 13.0–17.0)
Lymphocytes Relative: 30.3 % (ref 12.0–46.0)
Lymphs Abs: 2.1 10*3/uL (ref 0.7–4.0)
MCHC: 31.6 g/dL (ref 30.0–36.0)
MCV: 83.7 fL (ref 78.0–100.0)
Monocytes Absolute: 0.4 10*3/uL (ref 0.1–1.0)
Monocytes Relative: 5.8 % (ref 3.0–12.0)
Neutro Abs: 4.3 10*3/uL (ref 1.4–7.7)
Neutrophils Relative %: 61.6 % (ref 43.0–77.0)
Platelets: 253 10*3/uL (ref 150.0–400.0)
RBC: 5.01 Mil/uL (ref 4.22–5.81)
RDW: 13.9 % (ref 11.5–15.5)
WBC: 7 10*3/uL (ref 4.0–10.5)

## 2022-12-05 LAB — URINALYSIS, ROUTINE W REFLEX MICROSCOPIC
Bilirubin Urine: NEGATIVE
Hgb urine dipstick: NEGATIVE
Ketones, ur: NEGATIVE
Leukocytes,Ua: NEGATIVE
Nitrite: NEGATIVE
RBC / HPF: NONE SEEN (ref 0–?)
Specific Gravity, Urine: 1.015 (ref 1.000–1.030)
Total Protein, Urine: NEGATIVE
Urine Glucose: NEGATIVE
Urobilinogen, UA: 0.2 (ref 0.0–1.0)
pH: 6 (ref 5.0–8.0)

## 2022-12-05 MED ORDER — IOHEXOL 300 MG/ML  SOLN
100.0000 mL | Freq: Once | INTRAMUSCULAR | Status: AC | PRN
  Administered 2022-12-05: 100 mL via INTRAVENOUS

## 2022-12-05 MED ORDER — NA SULFATE-K SULFATE-MG SULF 17.5-3.13-1.6 GM/177ML PO SOLN: 1.0000 | Freq: Once | ORAL | 0 refills | Status: AC

## 2022-12-05 MED ORDER — IOHEXOL 9 MG/ML PO SOLN
500.0000 mL | ORAL | Status: AC
  Administered 2022-12-05: 1000 mL via ORAL

## 2022-12-05 MED ORDER — IOHEXOL 9 MG/ML PO SOLN
ORAL | Status: AC
  Filled 2022-12-05: qty 1000

## 2022-12-05 NOTE — Patient Instructions (Addendum)
Your provider has requested that you go to the basement level for lab work before leaving today. Press "B" on the elevator. The lab is located at the first door on the left as you exit the elevator.  Can take dicyclomine before you are eating for a few days scheduled to see if this helps.  Can do heating pad and can take tylenol max of 3000mg  a day.  Can add on lidocaine patches or voltern gel Go to the ER if unable to pass gas, severe AB pain, unable to hold down food, any shortness of breath of chest pain.  Low-Fiber Eating Plan Fiber is found in fruits, vegetables, whole grains, and beans. Eating a diet low in fiber helps you poop less often. A low-fiber eating plan may help your digestive system heal if: You have certain conditions, such as Crohn's disease, diverticulitis, or irritable bowel syndrome (IBS), and are having a flare-up. You have had radiation therapy on your pelvis or bowel. You have had surgery on your intestines. You have a new surgical opening in your abdomen called a colostomy or ileostomy. You have an intestine that has narrowed. Your health care provider will tell you how long to stay on this diet. You may find it helpful to work with a dietitian to plan your meals. What are tips for following this plan? Reading food labels  Check the nutrition facts label on food products for the amount of dietary fiber. Choose foods that have less than 2 grams (g) of fiber per serving. General information Eat 5-6 small meals throughout the day instead of 3 large meals. Follow instructions from your provider about how much fiber you should have each day and for how long you should follow a low fiber diet. Most people on a low-fiber eating plan should eat less than 10 g of fiber a day. Your daily fiber goal is _________________ g. What foods should I eat? Fruits Soft-cooked or canned fruits without skin and seeds. Ripe banana. Applesauce. Fruit juice without  pulp. Vegetables Well-cooked or canned vegetables without skin, seeds, or stems. Cooked potatoes without skins. Vegetable juice. Grains All bread and crackers made with white flour. Waffles, pancakes, and [place] toast. Bagels. Pretzels. Melba toast, zwieback, and matzoh. Cooked and dried cereals that do not have whole grains, added fiber, seeds, or dried fruit. Denzil Magnuson. Hot and cold cereals made with refined corn, rice, or oats. Plain pasta and noodles. White rice. Meats and other proteins Ground meat. Tender cuts of meat or poultry. Eggs. Fish, seafood, and shellfish. Smooth nut butters. Tofu. Dairy All milk products and drinks. Lactose-free milk, including rice, soy, and almond milk. Yogurt without fruit, nuts, chocolate, or granola mixed in. Sour cream. Cottage cheese. Cheese. Fats and oils Olive oil, canola oil, sunflower oil, flaxseed oil, avocado oil, and grapeseed oil. Mayonnaise. Cream cheese. Margarine. Butter. Beverages Decaf coffee. Fruit and vegetable juices. Smoothies in small amounts, with no pulp or skins, and with fruits from the recommended list. Sports drinks. Herbal tea. Water. Sweets and desserts Plain cakes. Cookies. Cream pies and pies made with recommended fruits. Pudding. Custard. Fruit gelatin. Sherbet. Ice pops. Ice cream without nuts. Hard candy. Honey. Jelly. Molasses. Syrups. Chocolate. Marshmallows. Gumdrops. Seasonings and condiments Ketchup. Mild mustard. Mild salad dressings. Plain gravies. Vinegar. Spices in moderation. Salt. Sugar. Other foods Bouillon. Broth. Cream and strained soups made from recommended foods. Casseroles made with recommended foods. The items listed above may not be all the foods and drinks you can have. Talk  to a dietitian to learn more. What foods should I avoid? Fruits Raw or dried fruit. Berries. Fruit juice with pulp. Prune juice. Vegetables Potato skins. Raw or undercooked vegetables. All beans and bean sprouts. Cooked greens. Corn.  Peas. Cabbage. Beets. Broccoli. Brussels sprouts. Cauliflower. Mushrooms. Onions. Peppers. Parsnips. Okra. Sauerkraut. Grains Whole-wheat, whole-grain, or multigrain breads, cereals, or crackers. Rye bread. Cereals with nuts, raisins, or coconut. Bran. Granola. High-fiber cereals. Cornmeal or corn bread. Whole-grain pasta. Wild or brown rice. Quinoa. Popcorn. Buckwheat. Wheat germ. Meats and other proteins Tough, fibrous meats with gristle. Fatty meat. Poultry with skin. Fried meat, Environmental education officer, or fish. Precooked or cured meat, such as sausages or meat loaves. Tomasa Blase. Hot dogs. Nuts and chunky nut butter. Dried peas, beans, and lentils. Hummus. Dairy Yogurt with fruit, nuts, chocolate, or granola mixed in. Full-fat dairy such as whole milk, ice cream, or sour cream. Beverages Caffeinated coffee and teas. Fats and oils Avocado. Coconut. Butter. Sweets and desserts Desserts, cookies, or candies that contain nuts or coconut. Dried fruit. Jams and preserves with seeds. Marmalade. Any dessert made with fruits or grains that are not recommended. Seasonings and condiments Relish. Horseradish. Rosita Fire. Olives. Other foods Corn tortilla chips. Soups made with vegetables or grains that are not recommended. The items listed above may not be all the foods and drinks you should avoid. Talk to a dietitian to learn more. This information is not intended to replace advice given to you by your health care provider. Make sure you discuss any questions you have with your health care provider. Document Revised: 05/05/2022 Document Reviewed: 05/05/2022 Elsevier Patient Education  2024 Elsevier Inc.    Diverticulitis Diverticulitis is inflammation or infection of small pouches in your colon that form when you have a condition called diverticulosis. The pouches in your colon are called diverticula. Your colon, or large intestine, is where water is absorbed and stool is formed. Complications of diverticulitis can  include: Bleeding. Severe infection. Severe pain. Perforation of your colon. Obstruction of your colon.  What are the causes? Diverticulitis is caused by bacteria. Diverticulitis happens when stool becomes trapped in diverticula. This allows bacteria to grow in the diverticula, which can lead to inflammation and infection. What increases the risk? People with diverticulosis are at risk for diverticulitis. Eating a diet that does not include enough fiber from fruits and vegetables may make diverticulitis more likely to develop. What are the signs or symptoms? Symptoms of diverticulitis may include: Abdominal pain and tenderness. The pain is normally located on the left side of the abdomen, but may occur in other areas. Fever and chills. Bloating. Cramping. Nausea. Vomiting. Constipation. Diarrhea. Blood in your stool.  How is this diagnosed? Your health care provider will ask you about your medical history and do a physical exam. You may need to have tests done because many medical conditions can cause the same symptoms as diverticulitis. Tests may include: Blood tests. Urine tests. Imaging tests of the abdomen, including X-rays and CT scans.  When your condition is under control, your health care provider may recommend that you have a colonoscopy. A colonoscopy can show how severe your diverticula are and whether something else is causing your symptoms. How is this treated? Most cases of diverticulitis are mild and can be treated at home. Treatment may include: Taking over-the-counter pain medicines. Following a clear liquid diet. Taking antibiotic medicines by mouth for 7-10 days.  More severe cases may be treated at a hospital. Treatment may include: Not eating or drinking.  Taking prescription pain medicine. Receiving antibiotic medicines through an IV tube. Receiving fluids and nutrition through an IV tube. Surgery.  Follow these instructions at home: Follow your  health care provider's instructions carefully. Follow a full liquid diet or other diet as directed by your health care provider. After your symptoms improve, your health care provider may tell you to change your diet. He or she may recommend you eat a high-fiber diet. Fruits and vegetables are good sources of fiber. Fiber makes it easier to pass stool. Take fiber supplements or probiotics as directed by your health care provider. Only take medicines as directed by your health care provider. Keep all your follow-up appointments. Contact a health care provider if: Your pain does not improve. You have a hard time eating food. Your bowel movements do not return to normal. Get help right away if: Your pain becomes worse. Your symptoms do not get better. Your symptoms suddenly get worse. You have a fever. You have repeated vomiting. You have bloody or black, tarry stools. This information is not intended to replace advice given to you by your health care provider. Make sure you discuss any questions you have with your health care provider. Document Released: 11/20/2004 Document Revised: 07/19/2015 Document Reviewed: 01/05/2013 Elsevier Interactive Patient Education  2017 Elsevier Inc.  [phone] Radiology For STAT CT TODAY AT [facility] 12/05/2022 AT 4, ARRIVE FOR CONTRAST AT 2 PM TODAY  Due to recent changes in healthcare laws, you may see the results of your imaging and laboratory studies on MyChart before your provider has had a chance to review them.  We understand that in some cases there may be results that are confusing or concerning to you. Not all laboratory results come back in the same time frame and the provider may be waiting for multiple results in order to interpret others.  Please give [place] 48 hours in order for your provider to thoroughly review all the results before contacting the office for clarification of your results.    I appreciate the  opportunity to care for you  Thank You    [facility]

## 2022-12-10 MED ORDER — METRONIDAZOLE 500 MG PO TABS: 500.0000 mg | ORAL_TABLET | Freq: Three times a day (TID) | ORAL | 0 refills | Status: DC

## 2022-12-10 MED ORDER — CIPROFLOXACIN HCL 500 MG PO TABS: 500.0000 mg | ORAL_TABLET | Freq: Two times a day (BID) | ORAL | 0 refills | Status: DC

## 2022-12-10 NOTE — Addendum Note (Signed)
Addended by: Quentin Mulling on: 12/10/2022 08:38 AM   Modules accepted: Orders

## 2022-12-22 ENCOUNTER — Encounter: Payer: Self-pay | Admitting: Gastroenterology

## 2022-12-24 ENCOUNTER — Ambulatory Visit: Payer: BC Managed Care – PPO | Admitting: Physician Assistant

## 2023-01-03 ENCOUNTER — Encounter: Payer: Self-pay | Admitting: Certified Registered Nurse Anesthetist

## 2023-01-06 ENCOUNTER — Ambulatory Visit (AMBULATORY_SURGERY_CENTER): Payer: BC Managed Care – PPO | Admitting: Gastroenterology

## 2023-01-06 ENCOUNTER — Encounter: Payer: Self-pay | Admitting: Gastroenterology

## 2023-01-06 VITALS — BP 126/85 | HR 66 | Temp 97.7°F | Resp 13 | Ht 72.0 in | Wt 254.6 lb

## 2023-01-06 DIAGNOSIS — K5730 Diverticulosis of large intestine without perforation or abscess without bleeding: Secondary | ICD-10-CM

## 2023-01-06 DIAGNOSIS — R1032 Left lower quadrant pain: Secondary | ICD-10-CM | POA: Diagnosis not present

## 2023-01-06 DIAGNOSIS — K5732 Diverticulitis of large intestine without perforation or abscess without bleeding: Secondary | ICD-10-CM

## 2023-01-06 MED ORDER — SODIUM CHLORIDE 0.9 % IV SOLN: 500.0000 mL | Freq: Once | INTRAVENOUS | Status: DC

## 2023-01-06 MED ORDER — AMOXICILLIN-POT CLAVULANATE 875-125 MG PO TABS
1.0000 | ORAL_TABLET | Freq: Two times a day (BID) | ORAL | 0 refills | Status: AC
Start: 2023-01-06 — End: 2023-01-13

## 2023-01-06 NOTE — Op Note (Signed)
[facility] Patient Name: Mark Alvarado Procedure Date: 01/06/2023 3:32 PM MRN: [id number] Endoscopist: Napoleon Form , MD, [id number] Age: 47 Referring MD:  Date of Birth: Aug 07, 1975 Gender: Male Account #: [id number] Procedure:                Colonoscopy Indications:              Abdominal pain in the left lower quadrant,                            Follow-up of diverticulitis Medicines:                Monitored Anesthesia Care Procedure:                Pre-Anesthesia Assessment:                           - Prior to the procedure, a History and Physical                            was performed, and patient medications and                            allergies were reviewed. The patient's tolerance of                            previous anesthesia was also reviewed. The risks                            and benefits of the procedure and the sedation                            options and risks were discussed with the patient.                            All questions were answered, and informed consent                            was obtained. Prior Anticoagulants: The patient has                            taken no anticoagulant or antiplatelet agents. ASA                            Grade Assessment: II - A patient with mild systemic                            disease. After reviewing the risks and benefits,                            the patient was deemed in satisfactory condition to                            undergo the procedure.  After obtaining informed consent, the colonoscope                            was passed under direct vision. Throughout the                            procedure, the patient's blood pressure, pulse, and                            oxygen saturations were monitored continuously. The                            [id number]-HQ190L Colonoscope [id number] was introduced                            through the anus and advanced to the the  cecum,                            identified by appendiceal orifice and ileocecal                            valve. The colonoscopy was performed without                            difficulty. The patient tolerated the procedure                            well. The quality of the bowel preparation was                            good. The ileocecal valve, appendiceal orifice, and                            rectum were photographed. Scope In: 3:54:58 PM Scope Out: 4:12:56 PM Scope Withdrawal Time: 0 hours 8 minutes 20 seconds  Total Procedure Duration: 0 hours 17 minutes 58 seconds  Findings:                 The perianal and digital rectal examinations were                            normal.                           Multiple large-mouthed, medium-mouthed and                            small-mouthed diverticula were found in the sigmoid                            colon, descending colon, transverse colon and                            ascending colon. There was narrowing of the colon  in association with the diverticular opening in                            sigmoid colon. Peri-diverticular erythema was seen.                            There was evidence of an impacted diverticulum.                            Purulent discharge was seen in association with the                            diverticular opening in sigmoid colon, suspicious                            of diverticulitis.                           Non-bleeding external and internal hemorrhoids were                            found during retroflexion. The hemorrhoids were                            medium-sized. Complications:            No immediate complications. Estimated Blood Loss:     Estimated blood loss was minimal. Impression:               - Severe diverticulosis in the sigmoid colon, in                            the descending colon, in the transverse colon and                            in  the ascending colon. There was narrowing of the                            colon in association with the diverticular opening.                            Peri-diverticular erythema was seen. There was                            evidence of an impacted diverticulum. Purulent                            discharge was seen in association with the                            diverticular opening, suspicious of diverticulitis.                           - Non-bleeding external and internal hemorrhoids.                           -  No specimens collected. Recommendation:           - Patient has a contact number available for                            emergencies. The signs and symptoms of potential                            delayed complications were discussed with the                            patient. Return to normal activities tomorrow.                            Written discharge instructions were provided to the                            patient.                           - Resume previous diet.                           - Continue present medications.                           - Repeat colonoscopy in 5 years for surveillance                            due to family h/o colon cancer Napoleon Form, MD 01/06/2023 4:23:59 PM This report has been signed electronically.

## 2023-01-06 NOTE — Patient Instructions (Signed)
Please read handouts provided. Continue present medications. Repeat colonoscopy in 5 years for screening.   YOU HAD AN ENDOSCOPIC PROCEDURE TODAY AT THE [facility]:   Refer to the procedure report that was given to you for any specific questions about what was found during the examination.  If the procedure report does not answer your questions, please call your gastroenterologist to clarify.  If you requested that your care partner not be given the details of your procedure findings, then the procedure report has been included in a sealed envelope for you to review at your convenience later.  YOU SHOULD EXPECT: Some feelings of bloating in the abdomen. Passage of more gas than usual.  Walking can help get rid of the air that was put into your GI tract during the procedure and reduce the bloating. If you had a lower endoscopy (such as a colonoscopy or flexible sigmoidoscopy) you may notice spotting of blood in your stool or on the toilet paper. If you underwent a bowel prep for your procedure, you may not have a normal bowel movement for a few days.  Please Note:  You might notice some irritation and congestion in your nose or some drainage.  This is from the oxygen used during your procedure.  There is no need for concern and it should clear up in a day or so.  SYMPTOMS TO REPORT IMMEDIATELY:  Following lower endoscopy (colonoscopy or flexible sigmoidoscopy):  Excessive amounts of blood in the stool  Significant tenderness or worsening of abdominal pains  Swelling of the abdomen that is new, acute  Fever of 100F or higher  For urgent or emergent issues, a gastroenterologist can be reached at any hour by calling [phone]. Do not use MyChart messaging for urgent concerns.    DIET:  We do recommend a small meal at first, but then you may proceed to your regular diet.  Drink plenty of fluids but you should avoid alcoholic beverages for 24 hours.  ACTIVITY:  You should plan  to take it easy for the rest of today and you should NOT DRIVE or use heavy machinery until tomorrow (because of the sedation medicines used during the test).    FOLLOW UP: Our staff will call the number listed on your records the next business day following your procedure.  We will call around 7:15- 8:00 am to check on you and address any questions or concerns that you may have regarding the information given to you following your procedure. If we do not reach you, we will leave a message.     If any biopsies were taken you will be contacted by phone or by letter within the next 1-3 weeks.  Please call us at [phone] if you have not heard about the biopsies in 3 weeks.    SIGNATURES/CONFIDENTIALITY: You and/or your care partner have signed paperwork which will be entered into your electronic medical record.  These signatures attest to the fact that that the information above on your After Visit Summary has been reviewed and is understood.  Full responsibility of the confidentiality of this discharge information lies with you and/or your care-partner. 

## 2023-01-06 NOTE — Progress Notes (Unsigned)
Report given to PACU, vss 

## 2023-01-06 NOTE — Progress Notes (Unsigned)
[facility] History and Physical   Primary Care Physician:  Shelva Majestic, MD   Reason for Procedure:  LLQ abd pain, recurrent diverticulitis  Plan:     colonoscopy with possible interventions as needed     HPI: Mark Alvarado is a very pleasant 47 y.o. male here for colonoscopy for LLQ abd pain, recurrent diverticulitis. Family h/o colon cancer and personal h/o adenomatous colon polyps   The risks and benefits as well as alternatives of endoscopic procedure(s) have been discussed and reviewed. All questions answered. The patient agrees to proceed.    Past Medical History:  Diagnosis Date   Allergy    Asthma    As a child   Chronic constipation    Diverticulitis    Diverticulosis    DVT (deep venous thrombosis) (HCC)    Hyperlipidemia    Hypertension    Lactose intolerance     Past Surgical History:  Procedure Laterality Date   COLONOSCOPY     left knee arthroscopic     1994- football injury    ROTATOR CUFF REPAIR Right    march 2019   ROTATOR CUFF REPAIR Left    Partial and AC joint repair december 2020    Prior to Admission medications   Medication Sig Start Date End Date Taking? Authorizing Provider  ciprofloxacin (CIPRO) 500 MG tablet Take 1 tablet (500 mg total) by mouth 2 (two) times daily. 12/10/22   Doree Albee, PA-C  dicyclomine (BENTYL) 20 MG tablet Take 1 tablet (20 mg total) by mouth every 8 (eight) hours as needed for spasms (AB pain). 11/17/22   Doree Albee, PA-C  metroNIDAZOLE (FLAGYL) 500 MG tablet Take 1 tablet (500 mg total) by mouth 3 (three) times daily. 12/10/22   Doree Albee, PA-C  MULTIPLE VITAMIN PO Take 1 tablet by mouth daily. Patient not taking: Reported on 12/05/2022    [provider]  polyethylene glycol powder (GLYCOLAX/MIRALAX) 17 GM/SCOOP powder Take 17 g by mouth daily. Patient not taking: Reported on 12/05/2022    [provider]  Red Yeast Rice Extract (RED YEAST RICE PO) Take 2  capsules by mouth daily. Patient not taking: Reported on 12/05/2022    [provider]  Wheat Dextrin (BENEFIBER PO) Take 2 tablets by mouth 2 (two) times daily. Gummies Patient not taking: Reported on 12/05/2022    [provider]    Current Outpatient Medications  Medication Sig Dispense Refill   ciprofloxacin (CIPRO) 500 MG tablet Take 1 tablet (500 mg total) by mouth 2 (two) times daily. 14 tablet 0   dicyclomine (BENTYL) 20 MG tablet Take 1 tablet (20 mg total) by mouth every 8 (eight) hours as needed for spasms (AB pain). 30 tablet 0   metroNIDAZOLE (FLAGYL) 500 MG tablet Take 1 tablet (500 mg total) by mouth 3 (three) times daily. 21 tablet 0   MULTIPLE VITAMIN PO Take 1 tablet by mouth daily. (Patient not taking: Reported on 12/05/2022)     polyethylene glycol powder (GLYCOLAX/MIRALAX) 17 GM/SCOOP powder Take 17 g by mouth daily. (Patient not taking: Reported on 12/05/2022)     Red Yeast Rice Extract (RED YEAST RICE PO) Take 2 capsules by mouth daily. (Patient not taking: Reported on 12/05/2022)     Wheat Dextrin (BENEFIBER PO) Take 2 tablets by mouth 2 (two) times daily. Gummies (Patient not taking: Reported on 12/05/2022)     Current Facility-Administered Medications  Medication Dose Route Frequency Provider Last Rate Last Admin  0.9 %  sodium chloride infusion  500 mL Intravenous Once Napoleon Form, MD        Allergies as of 01/06/2023 - Review Complete 01/06/2023  Allergen Reaction Noted   Bee pollen  12/27/2019   Dust mite extract  12/27/2019   Grass extracts [gramineae pollens]  12/27/2019   Hydrocodone-acetaminophen Other (See Comments) 12/05/2022   Pollen extract  12/27/2019    Family History  Problem Relation Age of Onset   High Cholesterol Mother    Hypertension Mother    High Cholesterol Father    Hypertension Father    Kidney disease Father        agent orange exposure- 100% disability   COPD Father    Colon cancer Sister         rare and typically find in men and boys- died in 8 months.    Cancer Sister    Colon cancer Maternal Aunt    Colon cancer Maternal Grandmother    Alzheimer's disease Paternal Grandmother    Healthy Sister    Other Maternal Grandfather        accidental   Heart attack Paternal Grandfather        early 16s   Colon cancer Paternal Uncle    Pancreatic cancer Neg Hx    Esophageal cancer Neg Hx    Stomach cancer Neg Hx    Liver disease Neg Hx     Social History   Socioeconomic History   Marital status: Married    Spouse name: Not on file   Number of children: Not on file   Years of education: Not on file   Highest education level: Not on file  Occupational History   Not on file  Tobacco Use   Smoking status: Never   Smokeless tobacco: Never  Vaping Use   Vaping status: Never Used  Substance and Sexual Activity   Alcohol use: Yes    Comment: rare   Drug use: Never   Sexual activity: Yes    Partners: Female  Other Topics Concern   Not on file  Social History Narrative   Married 2008. Mark Alvarado (2014), Mark Alvarado (2017). Mother in law lives with them.       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  Physical Exam: Vital signs in last 24 hours: BP (!) 141/94   Pulse 75   Temp 97.7 F (36.5 C)   Ht 6' (1.829 m)   Wt 254 lb 9.6 oz (115.5 kg)   SpO2 98%   BMI 34.53 kg/m  General:   Alert, NAD Lungs:  Clear .   Heart:  Regular rate and rhythm Abdomen:  Soft, nontender and nondistended. Neuro/Psych:  Alert and cooperative. Normal mood and affect. A and O x 3  Reviewed labs, radiology imaging, old records and pertinent past GI work up  Patient is appropriate for planned procedure(s) and anesthesia in an ambulatory setting   K. Scherry Ran , MD [phone]

## 2023-01-07 ENCOUNTER — Telehealth: Payer: Self-pay

## 2023-01-07 NOTE — Telephone Encounter (Signed)
  Follow up Call-     01/06/2023    3:43 PM 04/26/2020    8:09 AM  Call back number  Post procedure Call Back phone  # [phone]  Permission to leave phone message Yes Yes     Patient questions:  Do you have a fever, pain , or abdominal swelling? No. Pain Score  0 *  Have you tolerated food without any problems? Yes.    Have you been able to return to your normal activities? Yes.    Do you have any questions about your discharge instructions: Diet   No. Medications  No. Follow up visit  No.  Do you have questions or concerns about your Care? No.  Actions: * If pain score is 4 or above: No action needed, pain <4.

## 2023-01-09 NOTE — Telephone Encounter (Signed)
Called patient, unable to reach left a voice message.

## 2023-02-26 ENCOUNTER — Encounter: Payer: Self-pay | Admitting: Family Medicine

## 2023-02-26 ENCOUNTER — Ambulatory Visit: Payer: BC Managed Care – PPO | Admitting: Family Medicine

## 2023-02-26 VITALS — BP 132/84 | HR 80 | Temp 97.3°F | Ht 72.0 in | Wt 260.0 lb

## 2023-02-26 DIAGNOSIS — Z6835 Body mass index (BMI) 35.0-35.9, adult: Secondary | ICD-10-CM

## 2023-02-26 DIAGNOSIS — I10 Essential (primary) hypertension: Secondary | ICD-10-CM

## 2023-02-26 DIAGNOSIS — Z23 Encounter for immunization: Secondary | ICD-10-CM

## 2023-02-26 DIAGNOSIS — E785 Hyperlipidemia, unspecified: Secondary | ICD-10-CM

## 2023-02-26 DIAGNOSIS — R7303 Prediabetes: Secondary | ICD-10-CM

## 2023-02-26 DIAGNOSIS — Z131 Encounter for screening for diabetes mellitus: Secondary | ICD-10-CM | POA: Diagnosis not present

## 2023-02-26 LAB — LIPID PANEL
Cholesterol: 231 mg/dL — ABNORMAL HIGH (ref 0–200)
HDL: 38.7 mg/dL — ABNORMAL LOW (ref >39.00)
LDL Cholesterol: 156 mg/dL — ABNORMAL HIGH (ref 0–99)
NonHDL: 192.7
Total CHOL/HDL Ratio: 6
Triglycerides: 185 mg/dL — ABNORMAL HIGH (ref 0.0–149.0)
VLDL: 37 mg/dL (ref 0.0–40.0)

## 2023-02-26 LAB — COMPREHENSIVE METABOLIC PANEL
ALT: 21 U/L (ref 0–53)
AST: 22 U/L (ref 0–37)
Albumin: 4.2 g/dL (ref 3.5–5.2)
Alkaline Phosphatase: 47 U/L (ref 39–117)
BUN: 13 mg/dL (ref 6–23)
CO2: 27 meq/L (ref 19–32)
Calcium: 9.3 mg/dL (ref 8.4–10.5)
Chloride: 105 meq/L (ref 96–112)
Creatinine, Ser: 1.03 mg/dL (ref 0.40–1.50)
GFR: 85.12 mL/min (ref >60.00)
Glucose, Bld: 90 mg/dL (ref 70–99)
Potassium: 4.1 meq/L (ref 3.5–5.1)
Sodium: 139 meq/L (ref 135–145)
Total Bilirubin: 0.5 mg/dL (ref 0.2–1.2)
Total Protein: 7.4 g/dL (ref 6.0–8.3)

## 2023-02-26 LAB — HEMOGLOBIN A1C: Hgb A1c MFr Bld: 6.2 % (ref 4.6–6.5)

## 2023-02-26 NOTE — Progress Notes (Signed)
 Phone [phone] In person visit   Subjective:   Mark Alvarado is a 48 y.o. year old very pleasant male patient who presents for/with See problem oriented charting Chief Complaint  Patient presents with   Medical Management of Chronic Issues   Past Medical History-  Patient Active Problem List   Diagnosis Date Noted   NSVT (nonsustained ventricular tachycardia) (HCC) 02/04/2021    Priority: Medium    Hyperlipidemia 12/28/2019    Priority: Medium    Essential hypertension 12/28/2019    Priority: Medium    History of DVT (deep vein thrombosis) 07/23/2017    Priority: Medium    Nonspecific abnormal electrocardiogram (ECG) (EKG) 02/04/2021    Priority: Low   Diverticulosis of colon without hemorrhage 03/29/2020    Priority: Low   Family hx of colon cancer 03/29/2020    Priority: Low   Allergic rhinitis 12/28/2019    Priority: Low   Childhood asthma 12/28/2019    Priority: Low   GERD (gastroesophageal reflux disease) 12/28/2019    Priority: Low   Heart murmur 12/28/2019    Priority: Low    Medications- reviewed and updated No current outpatient medications on file.   No current facility-administered medications for this visit.     Objective:  BP 132/84   Pulse 80   Temp (!) 97.3 F (36.3 C)   Ht 6' (1.829 m)   Wt 260 lb (117.9 kg)   SpO2 97%   BMI 35.26 kg/m  Gen: NAD, resting comfortably CV: RRR no murmurs rubs or gallops Lungs: CTAB no crackles, wheeze, rhonchi Ext: no edema     Assessment and Plan   #hypertension #obesity- long run wants to around 215-220 S: medication: none Home readings #s: no recent checks -about 255 on home weights- peak was 267 at home and got as low as 249 before holidays BP Readings from Last 3 Encounters:  02/26/23 132/84  01/06/23 126/85  12/05/22 120/80  A/P: hypertension with reasonable control without medications- still wants to work on lifestyle   Obesity- wants to start with healthy weight to wellness with BMI  over 35 - in meantime discussed myfitnesspal  #hyperlipidemia- coronary artery calcium  score at 5 at  82nd percentile 01/09/21 S: Medication:none - inconsistent with red yeast rice- would be ok with medications if still high Lab Results  Component Value Date   CHOL 201 (H) 08/27/2022   HDL 34.10 (L) 08/27/2022   LDLCALC 145 (H) 08/27/2022   TRIG 109.0 08/27/2022   CHOLHDL 6 08/27/2022   A/P: lipids above goal- hed like to recheck and consider statin with coronary artery calcifications noted in past- but wants to continue to work on lifestyle as well so we don't raise prediabetes risk  # Hyperglycemia/insulin resistance/prediabetes S:  Medication: none Lab Results  Component Value Date   HGBA1C 6.0 08/27/2022   A/P: hopefully stable- update a1c today. Continue without meds for now   Recommended follow up: Return in about 6 months (around 08/26/2023) for physical or sooner if needed.Schedule b4 you leave.  Lab/Order associations:   ICD-10-CM   1. Essential hypertension  I10 Comprehensive metabolic panel    2. Hyperlipidemia, unspecified hyperlipidemia type  E78.5 Lipid panel    3. Severe obesity (BMI 35.0-35.9 with comorbidity) (HCC)  E66.01 Amb Ref to Medical Weight Management   Z68.35     4. Screening for diabetes mellitus  Z13.1 Hemoglobin A1c    5. Prediabetes  R73.03 Hemoglobin A1c    6. Need for influenza vaccination  Z23 Flu vaccine trivalent PF, 6mos and older(Flulaval,Afluria,Fluarix,Fluzone)     Time Spent: 20 minutes of total time (8:35 am- 9:01 AM) was spent on the date of the encounter performing the following actions: chart review prior to seeing the patient, obtaining history, performing a medically necessary exam, counseling on the treatment plan and ways to lose weight, placing orders, and documenting in our EHR.   Return precautions advised.  Garnette Lukes, MD

## 2023-02-26 NOTE — Patient Instructions (Addendum)
 Please stop by lab before you go If you have mychart- we will send your results within 3 business days of us  receiving them.  If you do not have mychart- we will call you about results within 5 business days of us  receiving them.  *please also note that you will see labs on mychart as soon as they post. I will later go in and write notes on them- will say notes from Dr. Katrinka   We have placed a referral for you today to healthy weight to wellness. In some cases you will see # listed below- you can call this if you have not heard within a week. If you do not see # listed- you should receive a mychart message or phone call within a week with the # to call directly- call that as soon as you get it. If you are having issues getting scheduled reach out to us  again.   While waiting could try myfitnesspal or weight watchers  Recommended follow up: Return in about 6 months (around 08/26/2023) for physical or sooner if needed.Schedule b4 you leave.

## 2023-03-02 ENCOUNTER — Other Ambulatory Visit: Payer: Self-pay

## 2023-03-02 MED ORDER — ROSUVASTATIN CALCIUM 10 MG PO TABS: 10.0000 mg | ORAL_TABLET | Freq: Every day | ORAL | 3 refills | Status: DC

## 2023-03-03 ENCOUNTER — Encounter: Payer: Self-pay | Admitting: Family Medicine

## 2023-03-26 ENCOUNTER — Other Ambulatory Visit: Payer: Self-pay

## 2023-03-26 ENCOUNTER — Telehealth: Payer: Self-pay | Admitting: Gastroenterology

## 2023-03-26 DIAGNOSIS — K5732 Diverticulitis of large intestine without perforation or abscess without bleeding: Secondary | ICD-10-CM

## 2023-03-26 DIAGNOSIS — R1032 Left lower quadrant pain: Secondary | ICD-10-CM

## 2023-03-26 MED ORDER — AMOXICILLIN-POT CLAVULANATE 875-125 MG PO TABS: 1.0000 | ORAL_TABLET | Freq: Two times a day (BID) | ORAL | 0 refills | Status: DC

## 2023-03-26 NOTE — Telephone Encounter (Signed)
DOD Patient of Dr Elana Alm with severe diverticulosis and recurrent diverticulitis. Last treated October 2024.  Patient reports 2 weeks of lower abdominal pain that has worsened in the past 2 days. The pain is now left lower to mid quadrant of the abdomen. Patient had "left over antibiotic" and therefore took a few doses of Flagyl and of Augmentin Saturday to Tuesday. He has also taken some hydrocodone to ease the pain. Bowels feel constipated. Passing "stringy stool." He is sleeping with a heating pad to the abdomen. Reports a general "pressure" sensation that worsens if he runs for his exercise. "Going over a speed bump in the car is painful." Please advise.

## 2023-03-26 NOTE — Telephone Encounter (Signed)
Inbound call from patient requesting a call regarding previous note. Please advise, thank you.

## 2023-03-26 NOTE — Telephone Encounter (Signed)
Inbound call from patient spouse in regards to patient experiencing abdominal pain. Requesting to speak with a nurse. Please advise.   Thank you

## 2023-03-26 NOTE — Telephone Encounter (Signed)
Spoke with the patient. Advised of the plan. Reinforced low residue diet, warning signs that would mean he should go to the ER and the referral to the colorectal surgeon. Patient expresses understanding. He tells me he has seen the surgeon in the past few months and will contact that office about scheduling surgery.

## 2023-03-27 ENCOUNTER — Telehealth: Payer: Self-pay

## 2023-03-27 ENCOUNTER — Other Ambulatory Visit: Payer: Self-pay

## 2023-03-27 ENCOUNTER — Ambulatory Visit (HOSPITAL_BASED_OUTPATIENT_CLINIC_OR_DEPARTMENT_OTHER)
Admission: RE | Admit: 2023-03-27 | Discharge: 2023-03-27 | Disposition: A | Discharge status: Home / Self Care | Payer: BC Managed Care – PPO | Source: Ambulatory Visit | Attending: Internal Medicine | Admitting: Internal Medicine

## 2023-03-27 DIAGNOSIS — R1032 Left lower quadrant pain: Secondary | ICD-10-CM | POA: Insufficient documentation

## 2023-03-27 DIAGNOSIS — R1030 Lower abdominal pain, unspecified: Secondary | ICD-10-CM | POA: Diagnosis not present

## 2023-03-27 DIAGNOSIS — K5732 Diverticulitis of large intestine without perforation or abscess without bleeding: Secondary | ICD-10-CM | POA: Diagnosis not present

## 2023-03-27 MED ORDER — IOHEXOL 300 MG/ML  SOLN
100.0000 mL | Freq: Once | INTRAMUSCULAR | Status: AC | PRN
  Administered 2023-03-27: 100 mL via INTRAVENOUS

## 2023-03-27 NOTE — Telephone Encounter (Signed)
Radiology calls. Stat CT report in EPIC. Confirms diverticulitis. No abscess seen. Patient is on Augmentin 875/125 mg for 10 days.

## 2023-03-27 NOTE — Telephone Encounter (Signed)
Patient aware of the results and the recommendations.

## 2023-04-01 ENCOUNTER — Encounter: Payer: Self-pay | Admitting: Family Medicine

## 2023-04-16 ENCOUNTER — Encounter (INDEPENDENT_AMBULATORY_CARE_PROVIDER_SITE_OTHER): Payer: Self-pay

## 2023-04-29 ENCOUNTER — Encounter (INDEPENDENT_AMBULATORY_CARE_PROVIDER_SITE_OTHER): Payer: Self-pay

## 2023-06-17 ENCOUNTER — Telehealth: Payer: Self-pay | Admitting: Gastroenterology

## 2023-06-17 NOTE — Telephone Encounter (Signed)
 Inbound call from patients wife stating that patient is having a diverticulitis flare and is requesting to be seen ASAP. I advised patients wife the soonest I had was 5/30 with one of Dr. Allean Aran PA's. She requested I send a message to the nurse to see if he can be seen tomorrow. Please advise.

## 2023-06-17 NOTE — Telephone Encounter (Signed)
 Patient of Dr Leonia Raman with LLQ pain and a history of recurrent diverticulitis. Last flare was in January, confirmed by CT. He was treated with Augmentin . Patient had his spouse call today. He has been experiencing LLQ abdominal pain that started about 3 days ago. It is worsening. He tells her it is his usual symptoms when he has diverticulitis. Spouse confirms he is afebrile. Appointment not available today. Agrees to 06/18/23 at 2:00 pm with Santina Cull, PA. Understands to go to the ER if he acutely worsens.

## 2023-06-18 ENCOUNTER — Encounter: Payer: Self-pay | Admitting: Physician Assistant

## 2023-06-18 ENCOUNTER — Telehealth: Payer: Self-pay | Admitting: Gastroenterology

## 2023-06-18 ENCOUNTER — Other Ambulatory Visit (INDEPENDENT_AMBULATORY_CARE_PROVIDER_SITE_OTHER)

## 2023-06-18 ENCOUNTER — Ambulatory Visit (HOSPITAL_COMMUNITY)
Admission: RE | Admit: 2023-06-18 | Discharge: 2023-06-18 | Disposition: A | Discharge status: Home / Self Care | Source: Ambulatory Visit | Attending: Physician Assistant | Admitting: Physician Assistant

## 2023-06-18 ENCOUNTER — Ambulatory Visit: Admitting: Physician Assistant

## 2023-06-18 VITALS — BP 118/64 | HR 96 | Ht 72.0 in | Wt 247.0 lb

## 2023-06-18 DIAGNOSIS — K5732 Diverticulitis of large intestine without perforation or abscess without bleeding: Secondary | ICD-10-CM

## 2023-06-18 DIAGNOSIS — R3915 Urgency of urination: Secondary | ICD-10-CM | POA: Diagnosis not present

## 2023-06-18 DIAGNOSIS — R350 Frequency of micturition: Secondary | ICD-10-CM | POA: Diagnosis not present

## 2023-06-18 DIAGNOSIS — R3911 Hesitancy of micturition: Secondary | ICD-10-CM

## 2023-06-18 DIAGNOSIS — R1032 Left lower quadrant pain: Secondary | ICD-10-CM | POA: Diagnosis not present

## 2023-06-18 DIAGNOSIS — Z800 Family history of malignant neoplasm of digestive organs: Secondary | ICD-10-CM | POA: Diagnosis not present

## 2023-06-18 LAB — URINALYSIS, ROUTINE W REFLEX MICROSCOPIC
Bilirubin Urine: NEGATIVE
Hgb urine dipstick: NEGATIVE
Ketones, ur: NEGATIVE
Leukocytes,Ua: NEGATIVE
Nitrite: NEGATIVE
RBC / HPF: NONE SEEN (ref 0–?)
Specific Gravity, Urine: 1.01 (ref 1.000–1.030)
Total Protein, Urine: NEGATIVE
Urine Glucose: NEGATIVE
Urobilinogen, UA: 0.2 (ref 0.0–1.0)
pH: 6 (ref 5.0–8.0)

## 2023-06-18 LAB — CBC WITH DIFFERENTIAL/PLATELET
Basophils Absolute: 0.1 10*3/uL (ref 0.0–0.1)
Basophils Relative: 0.7 % (ref 0.0–3.0)
Eosinophils Absolute: 0.1 10*3/uL (ref 0.0–0.7)
Eosinophils Relative: 1 % (ref 0.0–5.0)
HCT: 42.1 % (ref 39.0–52.0)
Hemoglobin: 13.8 g/dL (ref 13.0–17.0)
Lymphocytes Relative: 19.8 % (ref 12.0–46.0)
Lymphs Abs: 2.7 10*3/uL (ref 0.7–4.0)
MCHC: 32.7 g/dL (ref 30.0–36.0)
MCV: 84.3 fl (ref 78.0–100.0)
Monocytes Absolute: 1 10*3/uL (ref 0.1–1.0)
Monocytes Relative: 7.4 % (ref 3.0–12.0)
Neutro Abs: 9.5 10*3/uL — ABNORMAL HIGH (ref 1.4–7.7)
Neutrophils Relative %: 71.1 % (ref 43.0–77.0)
Platelets: 246 10*3/uL (ref 150.0–400.0)
RBC: 4.99 Mil/uL (ref 4.22–5.81)
RDW: 14.5 % (ref 11.5–15.5)
WBC: 13.4 10*3/uL — ABNORMAL HIGH (ref 4.0–10.5)

## 2023-06-18 LAB — COMPREHENSIVE METABOLIC PANEL WITH GFR
ALT: 13 U/L (ref 0–53)
AST: 15 U/L (ref 0–37)
Albumin: 4.4 g/dL (ref 3.5–5.2)
Alkaline Phosphatase: 47 U/L (ref 39–117)
BUN: 13 mg/dL (ref 6–23)
CO2: 29 meq/L (ref 19–32)
Calcium: 9.6 mg/dL (ref 8.4–10.5)
Chloride: 100 meq/L (ref 96–112)
Creatinine, Ser: 1.09 mg/dL (ref 0.40–1.50)
GFR: 79.36 mL/min (ref >60.00)
Glucose, Bld: 86 mg/dL (ref 70–99)
Potassium: 4.1 meq/L (ref 3.5–5.1)
Sodium: 137 meq/L (ref 135–145)
Total Bilirubin: 0.9 mg/dL (ref 0.2–1.2)
Total Protein: 8 g/dL (ref 6.0–8.3)

## 2023-06-18 LAB — SEDIMENTATION RATE: Sed Rate: 73 mm/h — ABNORMAL HIGH (ref 0–20)

## 2023-06-18 MED ORDER — METRONIDAZOLE 500 MG PO TABS: 500.0000 mg | ORAL_TABLET | Freq: Three times a day (TID) | ORAL | 0 refills | Status: DC

## 2023-06-18 MED ORDER — IOHEXOL 300 MG/ML  SOLN
100.0000 mL | Freq: Once | INTRAMUSCULAR | Status: AC | PRN
  Administered 2023-06-18: 100 mL via INTRAVENOUS

## 2023-06-18 MED ORDER — IRON SUCROSE 200 MG IVPB - SIMPLE MED
200.0000 mg | 0 refills | Status: DC
Start: 2023-06-18 — End: 2023-06-18

## 2023-06-18 MED ORDER — IOHEXOL 9 MG/ML PO SOLN
1000.0000 mL | ORAL | Status: AC
Start: 2023-06-18 — End: 2023-06-18
  Administered 2023-06-18: 1000 mL via ORAL

## 2023-06-18 MED ORDER — CIPROFLOXACIN HCL 500 MG PO TABS: 500.0000 mg | ORAL_TABLET | Freq: Two times a day (BID) | ORAL | 0 refills | Status: DC

## 2023-06-18 NOTE — Progress Notes (Signed)
 06/18/2023 Mark Alvarado [id number] [birth year]  Referring provider: Almira Jaeger, MD Primary GI doctor: Dr. Leonia Raman  ASSESSMENT AND PLAN:  History of diverticulitis, gas symptoms started 04/16, took gas x but got progressively worse with Sunday with classic symptoms and last night the worse of all the episodes has a lot of urinary symptoms with it hesitancy and urgency, no dysuria Diverticulitis flare 09/2022, 10/2022, 11/2022, 12/2022, 02/2023 Last flare in January 2025 confirmed by CT treated with Augmentin  Up-to-date on colonoscopy 12/2022 which showed severe sigmoid diverticulitis, impacted purulent Takes tylenol , no NSAIDS Suspicious for diverticulitis/colitis with history and physical exam, patient hemodynamically stable but appears to be worse episode - get CBC, CMET, and sed rate. -Will schedule for CT AB and pelvis with contrast to evaluate further to rule out complications - check urinalysis -IBGARD daily, will give Bentyl  as needed, heating pad and liquid diet. - Prescribed Cipro  and Flagyl , may need IV ABX - ER precautions discussed with the patient -will follow back up with [facility], last saw 11/2022 Dr. Alisia Apple  Family history of colon cancer Recall colonoscopy 5 years, due 12/2018  GERD  NSVT/murmur 2022 echocardiogram normal EF unremarkable valves  Patient Care Team: Almira Jaeger, MD as PCP - General (Family Medicine) Eilleen Grates, MD as Consulting Physician (Cardiology)  HISTORY OF PRESENT ILLNESS: 48 y.o. male with a past medical history listed below presents for evaluation of diverticulitis.   Discussed the use of AI scribe software for clinical note transcription with the patient, who gave verbal consent to proceed.  History of Present Illness   Mark Alvarado is a 48 year old male with recurrent diverticulitis who presents with worsening abdominal pain and urinary symptoms.  He has been experiencing worsening abdominal pain and urinary  symptoms. The abdominal pain is described as throbbing and located in the left lower quadrant, with some suprapubic pain. The pain began mildly on June 10, 2023, and has progressively worsened, with the worst episode occurring last night. The pain is slightly relieved by urination, which is often just a trickle, providing a sense of pressure relief.  He has a history of recurrent episodes of similar symptoms, previously treated with Augmentin  and ciproflagyl. Augmentin  is a large pill taken twice a day, while ciproflagyl is multiple pills twice a day, with an unpleasant taste. He has been treated with Augmentin  multiple times, but it has not resolved his symptoms. Valium provided significant pain relief during a previous episode.  He denies taking any anti-inflammatories like Aleve or ibuprofen, but occasionally uses Tylenol  500 mg for pain relief. He also tried gas relief chewables initially, thinking the pain was due to gas. No fever, chills, or significant nausea, although he felt slightly nauseous after an eye exam earlier today. No burning sensation during urination, but experiences hesitancy and frequent urination at night.  He mentions a previous normal prostate screening and a normal PSA level of 1.5 in July 2024. He also recalls a normal urine test in October 2024. 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      He  reports that he has never smoked. He has never used smokeless tobacco. He reports current alcohol use. He reports that he does not use drugs.  RELEVANT GI HISTORY, IMAGING AND LABS: Results   LABS PSA: 1.5 (08/2022) Urinalysis: normal (11/2022)      CBC    Component  Value Date/Time   WBC 7.0 12/05/2022 1018   RBC 5.01 12/05/2022 1018   HGB 13.3 12/05/2022 1018   HCT 41.9 12/05/2022 1018   PLT 253.0 12/05/2022 1018   MCV 83.7 12/05/2022 1018   MCH 27.2 10/12/2022 0753   MCHC  31.6 12/05/2022 1018   RDW 13.9 12/05/2022 1018   LYMPHSABS 2.1 12/05/2022 1018   MONOABS 0.4 12/05/2022 1018   EOSABS 0.1 12/05/2022 1018   BASOSABS 0.0 12/05/2022 1018   Recent Labs    08/27/22 0849 10/08/22 1027 10/12/22 0753 11/18/22 0923 12/05/22 1018  HGB 13.9 14.0 14.0 13.8 13.3    CMP     Component Value Date/Time   NA 139 02/26/2023 0904   NA 141 01/07/2021 1606   K 4.1 02/26/2023 0904   CL 105 02/26/2023 0904   CO2 27 02/26/2023 0904   GLUCOSE 90 02/26/2023 0904   BUN 13 02/26/2023 0904   BUN 15 01/07/2021 1606   CREATININE 1.03 02/26/2023 0904   CALCIUM  9.3 02/26/2023 0904   PROT 7.4 02/26/2023 0904   ALBUMIN 4.2 02/26/2023 0904   AST 22 02/26/2023 0904   ALT 21 02/26/2023 0904   ALKPHOS 47 02/26/2023 0904   BILITOT 0.5 02/26/2023 0904   GFRNONAA >60 10/12/2022 0753   GFRAA 87 10/27/2018 0000      Latest Ref Rng & Units 02/26/2023    9:04 AM 12/05/2022   10:18 AM 11/18/2022    9:23 AM  Hepatic Function  Total Protein 6.0 - 8.3 g/dL 7.4  7.2  8.3   Albumin 3.5 - 5.2 g/dL 4.2  4.3  4.4   AST 0 - 37 U/L 22  15  15    ALT 0 - 53 U/L 21  15  14    Alk Phosphatase 39 - 117 U/L 47  52  56   Total Bilirubin 0.2 - 1.2 mg/dL 0.5  0.5  0.6       Current Medications:    Current Outpatient Medications (Cardiovascular):    rosuvastatin  (CRESTOR ) 10 MG tablet, Take 1 tablet (10 mg total) by mouth daily. (Patient not taking: Reported on 06/18/2023)   Current Outpatient Medications (Analgesics):    acetaminophen  (TYLENOL ) 650 MG CR tablet, Take 650 mg by mouth every 8 (eight) hours as needed for pain.   Current Outpatient Medications (Other):    ciprofloxacin  (CIPRO ) 500 MG tablet, Take 1 tablet (500 mg total) by mouth 2 (two) times daily.   metroNIDAZOLE  (FLAGYL ) 500 MG tablet, Take 1 tablet (500 mg total) by mouth 3 (three) times daily.  Medical History:  Past Medical History:  Diagnosis Date   Allergy    Asthma    As a child   Chronic constipation     Diverticulitis    Diverticulosis    DVT (deep venous thrombosis) (HCC)    Hyperlipidemia    Hypertension    Lactose intolerance    Allergies:  Allergies  Allergen Reactions   Bee Pollen     Other Reaction(s): Cough   Dust Mite Extract    Grass Extracts [Gramineae Pollens]    Hydrocodone -Acetaminophen  Other (See Comments)    Lethargic, felt out of body   Pollen Extract      Surgical History:  He  has a past surgical history that includes Rotator cuff repair (Right); Rotator cuff repair (Left); left knee arthroscopic; and Colonoscopy. Family History:  His family history includes Alzheimer's disease in his paternal grandmother; COPD in his father; Cancer in his sister; Colon cancer  in his maternal aunt, maternal grandmother, paternal uncle, and sister; Healthy in his sister; Heart attack in his paternal grandfather; High Cholesterol in his father and mother; Hypertension in his father and mother; Kidney disease in his father; Other in his maternal grandfather.  REVIEW OF SYSTEMS  : All other systems reviewed and negative except where noted in the History of Present Illness.  PHYSICAL EXAM: BP 118/64   Pulse 96   Ht 6' (1.829 m)   Wt 247 lb (112 kg)   BMI 33.50 kg/m  Physical Exam   GENERAL APPEARANCE: Well nourished, in no apparent distress. HEENT: No cervical lymphadenopathy, unremarkable thyroid, sclerae anicteric, conjunctiva pink. RESPIRATORY: Respiratory effort normal, breath sounds equal bilaterally without rales, rhonchi, or wheezing. CARDIO: Regular rate and rhythm with no murmurs, rubs, or gallops, peripheral pulses intact. ABDOMEN: Soft, non-distended, active bowel sounds in all four quadrants. Tenderness to palpation in the left lower quadrant with rebound tenderness. Suprapubic pain on palpation. No mass appreciated. RECTAL: Declines. MUSCULOSKELETAL: Full range of motion, normal gait, without edema. SKIN: Dry, intact without rashes or lesions. No jaundice. NEURO:  Alert, oriented, no focal deficits. PSYCH: Cooperative, normal mood and affect.      Edmonia Gottron, PA-C 2:37 PM

## 2023-06-18 NOTE — Patient Instructions (Signed)
 Your provider has requested that you go to the basement level for lab work before leaving today. Press "B" on the elevator. The lab is located at the first door on the left as you exit the elevator.  Will give Cipro  and Flagyl , may need to get IV ABX Set up CT AB and pelvis with contrast Liquid diet, low fiber Can take dicyclomine  at least 1-2 x a day for pain if needed.   Can do heating pad and can take tylenol  max of 3000mg  a day.  Can add on lidocaine patches or voltern gel Go to the ER if unable to pass gas, severe AB pain, unable to hold down food, any shortness of breath of chest pain.  You have been scheduled for a CT scan of the abdomen and pelvis at [facility], 1st floor Radiology. You are scheduled on Thursday, 06/18/23 at 4:30pm. You should arrive 15 minutes prior to your appointment time for registration.     You may take any medications as prescribed with a small amount of water, if necessary. If you take any of the following medications: METFORMIN, GLUCOPHAGE, GLUCOVANCE, AVANDAMET, RIOMET, FORTAMET, ACTOPLUS MET, JANUMET, GLUMETZA or METAGLIP, you MAY be asked to HOLD this medication 48 hours AFTER the exam.   The purpose of you drinking the oral contrast is to aid in the visualization of your intestinal tract. The contrast solution may cause some diarrhea. Depending on your individual set of symptoms, you may also receive an intravenous injection of x-ray contrast/dye. Plan on being at [facility] for 45 minutes or longer, depending on the type of exam you are having performed.   If you have any questions regarding your exam or if you need to reschedule, you may call [facility] at [phone] between the hours of 8:00 am and 5:00 pm, Monday-Friday.    Thank you for trusting me with your gastrointestinal care!   Santina Cull, PA-C  _______________________________________________________  If your blood pressure at your visit was 140/90 or greater,  please contact your primary care physician to follow up on this.  _______________________________________________________  If you are age 87 or older, your body mass index should be between 23-30. Your Body mass index is 33.5 kg/m. If this is out of the aforementioned range listed, please consider follow up with your Primary Care Provider.  If you are age 9 or younger, your body mass index should be between 19-25. Your Body mass index is 33.5 kg/m. If this is out of the aformentioned range listed, please consider follow up with your Primary Care Provider.   ________________________________________________________  The [facility] providers would like to encourage you to use MYCHART to communicate with providers for non-urgent requests or questions.  Due to long hold times on the telephone, sending your provider a message by [facility] may be a faster and more efficient way to get a response.  Please allow 48 business hours for a response.  Please remember that this is for non-urgent requests.  _______________________________________________________      Diverticulitis Diverticulitis is inflammation or infection of small pouches in your colon that form when you have a condition called diverticulosis. The pouches in your colon are called diverticula. Your colon, or large intestine, is where water is absorbed and stool is formed. Complications of diverticulitis can include: Bleeding. Severe infection. Severe pain. Perforation of your colon. Obstruction of your colon.  What are the causes? Diverticulitis is caused by bacteria. Diverticulitis happens when stool becomes trapped in diverticula. This  allows bacteria to grow in the diverticula, which can lead to inflammation and infection. What increases the risk? People with diverticulosis are at risk for diverticulitis. Eating a diet that does not include enough fiber from fruits and vegetables may make diverticulitis more likely to  develop. What are the signs or symptoms? Symptoms of diverticulitis may include: Abdominal pain and tenderness. The pain is normally located on the left side of the abdomen, but may occur in other areas. Fever and chills. Bloating. Cramping. Nausea. Vomiting. Constipation. Diarrhea. Blood in your stool.  How is this diagnosed? Your health care provider will ask you about your medical history and do a physical exam. You may need to have tests done because many medical conditions can cause the same symptoms as diverticulitis. Tests may include: Blood tests. Urine tests. Imaging tests of the abdomen, including X-rays and CT scans.  When your condition is under control, your health care provider may recommend that you have a colonoscopy. A colonoscopy can show how severe your diverticula are and whether something else is causing your symptoms. How is this treated? Most cases of diverticulitis are mild and can be treated at home. Treatment may include: Taking over-the-counter pain medicines. Following a clear liquid diet. Taking antibiotic medicines by mouth for 7-10 days.  More severe cases may be treated at a hospital. Treatment may include: Not eating or drinking. Taking prescription pain medicine. Receiving antibiotic medicines through an IV tube. Receiving fluids and nutrition through an IV tube. Surgery.  Follow these instructions at home: Follow your health care provider's instructions carefully. Follow a full liquid diet or other diet as directed by your health care provider. After your symptoms improve, your health care provider may tell you to change your diet. He or she may recommend you eat a high-fiber diet. Fruits and vegetables are good sources of fiber. Fiber makes it easier to pass stool. Take fiber supplements or probiotics as directed by your health care provider. Only take medicines as directed by your health care provider. Keep all your follow-up  appointments. Contact a health care provider if: Your pain does not improve. You have a hard time eating food. Your bowel movements do not return to normal. Get help right away if: Your pain becomes worse. Your symptoms do not get better. Your symptoms suddenly get worse. You have a fever. You have repeated vomiting. You have bloody or black, tarry stools. This information is not intended to replace advice given to you by your health care provider. Make sure you discuss any questions you have with your health care provider. Document Released: 11/20/2004 Document Revised: 07/19/2015 Document Reviewed: 01/05/2013 Elsevier Interactive Patient Education  2017 [employer].

## 2023-06-18 NOTE — Telephone Encounter (Signed)
 Received a call from the Radiologist regarding CT results which show acute uncomplicated sigmoid diverticulitis.  No abscess.  I called the patient to discuss these results.  He has already picked up the Cipro /Flagyl  that was prescribed earlier today and has already started.  He does have Bentyl  to take for any abdominal pain/spasm.  Also okay to use Tylenol  as needed.  I also reviewed his labs from earlier today which show a leukocytosis (13), elevated ESR in the 70s, all consistent with acute inflammatory process.  Otherwise normal CMP.  Continue Cipro /Flagyl  until complete.  Will have GI team reach out to him tomorrow to see how he is feeling and continue following.  He is planning on reaching out to his surgeon as well to discuss his acute, recurrent sigmoid diverticulitis.  All questions answered and he was very appreciative for the phone call.

## 2023-06-19 DIAGNOSIS — Z8719 Personal history of other diseases of the digestive system: Secondary | ICD-10-CM

## 2023-06-19 DIAGNOSIS — R1032 Left lower quadrant pain: Secondary | ICD-10-CM

## 2023-06-19 NOTE — Telephone Encounter (Signed)
 Called to check on patient & he is noticing some improvements today. Still a little achey, but he has been able to sit through work meetings today & bm's are less frequent. Advised him to give us  a call back next week if symptoms persist/worsen.

## 2023-06-22 MED ORDER — AMOXICILLIN-POT CLAVULANATE 875-125 MG PO TABS
1.0000 | ORAL_TABLET | Freq: Two times a day (BID) | ORAL | 0 refills | Status: DC
Start: 2023-06-22 — End: 2023-07-07

## 2023-06-22 MED ORDER — ONDANSETRON HCL 4 MG PO TABS: 4.0000 mg | ORAL_TABLET | Freq: Three times a day (TID) | ORAL | 1 refills | Status: DC | PRN

## 2023-06-22 NOTE — Telephone Encounter (Signed)
 Patient has had multiple diverticulitis episodes CT on Friday showed no complication, continues to have pain while on Cipro  Flagyl  we will switch to Augmentin  twice daily for 10 days. Patient has seen surgery in the past and will have scheduled follow-up with surgery to evaluate for recurrent diverticulitis episodes. Advised to go to the ER if there is any severe abdominal pain, unable to hold down food/water, or any worsening symptoms go to the ER for IV ABX. I can send in antinausea, you can take the dicyclomine  up to 4 x a day, continue soft/low fiber diet.

## 2023-06-23 ENCOUNTER — Emergency Department (HOSPITAL_BASED_OUTPATIENT_CLINIC_OR_DEPARTMENT_OTHER)
Admission: EM | Admit: 2023-06-23 | Discharge: 2023-06-24 | Disposition: A | Discharge status: Home / Self Care | Attending: Emergency Medicine | Admitting: Emergency Medicine

## 2023-06-23 ENCOUNTER — Encounter (HOSPITAL_BASED_OUTPATIENT_CLINIC_OR_DEPARTMENT_OTHER): Payer: Self-pay | Admitting: Emergency Medicine

## 2023-06-23 DIAGNOSIS — K5792 Diverticulitis of intestine, part unspecified, without perforation or abscess without bleeding: Secondary | ICD-10-CM

## 2023-06-23 DIAGNOSIS — K5732 Diverticulitis of large intestine without perforation or abscess without bleeding: Secondary | ICD-10-CM | POA: Insufficient documentation

## 2023-06-23 DIAGNOSIS — R1032 Left lower quadrant pain: Secondary | ICD-10-CM | POA: Diagnosis not present

## 2023-06-23 LAB — URINALYSIS, ROUTINE W REFLEX MICROSCOPIC
Bacteria, UA: NONE SEEN
Bilirubin Urine: NEGATIVE
Glucose, UA: NEGATIVE mg/dL
Hgb urine dipstick: NEGATIVE
Ketones, ur: 15 mg/dL — AB
Nitrite: NEGATIVE
Protein, ur: NEGATIVE mg/dL
Specific Gravity, Urine: 1.023 (ref 1.005–1.030)
pH: 5 (ref 5.0–8.0)

## 2023-06-23 LAB — CBC
HCT: 40.3 % (ref 39.0–52.0)
Hemoglobin: 13.1 g/dL (ref 13.0–17.0)
MCH: 26.8 pg (ref 26.0–34.0)
MCHC: 32.5 g/dL (ref 30.0–36.0)
MCV: 82.6 fL (ref 80.0–100.0)
Platelets: 307 10*3/uL (ref 150–400)
RBC: 4.88 MIL/uL (ref 4.22–5.81)
RDW: 13.3 % (ref 11.5–15.5)
WBC: 11.4 10*3/uL — ABNORMAL HIGH (ref 4.0–10.5)
nRBC: 0 % (ref 0.0–0.2)

## 2023-06-23 LAB — COMPREHENSIVE METABOLIC PANEL WITH GFR
ALT: 13 U/L (ref 0–44)
AST: 21 U/L (ref 15–41)
Albumin: 4.1 g/dL (ref 3.5–5.0)
Alkaline Phosphatase: 50 U/L (ref 38–126)
Anion gap: 12 (ref 5–15)
BUN: 15 mg/dL (ref 6–20)
CO2: 23 mmol/L (ref 22–32)
Calcium: 9.2 mg/dL (ref 8.9–10.3)
Chloride: 103 mmol/L (ref 98–111)
Creatinine, Ser: 1.16 mg/dL (ref 0.61–1.24)
GFR, Estimated: >60 mL/min (ref >60)
Glucose, Bld: 90 mg/dL (ref 70–99)
Potassium: 4 mmol/L (ref 3.5–5.1)
Sodium: 138 mmol/L (ref 135–145)
Total Bilirubin: 0.4 mg/dL (ref 0.0–1.2)
Total Protein: 7.2 g/dL (ref 6.5–8.1)

## 2023-06-23 LAB — LIPASE, BLOOD: Lipase: 20 U/L (ref 11–51)

## 2023-06-23 NOTE — ED Triage Notes (Signed)
 Hx: Diverticulitis Abdo pain/ aches started about 2 weeks ago Getting worse Decreased app  Burning with urination PCP work-up treating with cipro  and flagy started Thursday and amox today

## 2023-06-24 MED ORDER — HYDROCODONE-ACETAMINOPHEN 5-325 MG PO TABS: 1.0000 | ORAL_TABLET | Freq: Four times a day (QID) | ORAL | 0 refills | Status: DC | PRN

## 2023-06-24 MED ORDER — MORPHINE SULFATE (PF) 4 MG/ML IV SOLN
4.0000 mg | Freq: Once | INTRAVENOUS | Status: AC
  Administered 2023-06-24: 4 mg via INTRAVENOUS
  Filled 2023-06-24: qty 1

## 2023-06-24 MED ORDER — SODIUM CHLORIDE 0.9 % IV SOLN: Freq: Once | INTRAVENOUS | Status: DC

## 2023-06-24 MED ORDER — SODIUM CHLORIDE 0.9 % IV BOLUS
1000.0000 mL | Freq: Once | INTRAVENOUS | Status: AC
  Administered 2023-06-24: 1000 mL via INTRAVENOUS

## 2023-06-24 MED ORDER — ONDANSETRON HCL 4 MG/2ML IJ SOLN
4.0000 mg | Freq: Once | INTRAMUSCULAR | Status: AC
  Administered 2023-06-24: 4 mg via INTRAVENOUS
  Filled 2023-06-24: qty 2

## 2023-06-24 NOTE — Discharge Instructions (Signed)
 Continue taking Augmentin  as previously prescribed.  Take hydrocodone  as prescribed as needed for pain.  Follow-up with your surgeon/gastroenterologist in the next week.

## 2023-06-24 NOTE — ED Provider Notes (Signed)
 [place] EMERGENCY DEPARTMENT AT [facility] Provider Note   CSN: [id number] Arrival date & time: 06/23/23  2225     History  Chief Complaint  Patient presents with   Abdominal Pain    Mark Alvarado is a 48 y.o. male.  Patient is a 48 year old male with history of recurrent diverticulitis.  Patient presenting with left lower quadrant pain that has been ongoing for the past week.  He was seen by his primary doctor on Thursday and had a CT scan showing diverticulitis.  He has been taking Cipro  and Flagyl  since, but symptoms persist.  He was changed to Augmentin  yesterday, but remains with discomfort.  No bloody stool.  No fevers or chills.       Home Medications Prior to Admission medications   Medication Sig Start Date End Date Taking? Authorizing Provider  acetaminophen  (TYLENOL ) 650 MG CR tablet Take 650 mg by mouth every 8 (eight) hours as needed for pain.    [provider]  amoxicillin -clavulanate (AUGMENTIN ) 875-125 MG tablet Take 1 tablet by mouth 2 (two) times daily. 06/22/23   Edmonia Gottron, PA-C  ondansetron  (ZOFRAN ) 4 MG tablet Take 1 tablet (4 mg total) by mouth every 8 (eight) hours as needed for nausea or vomiting. 06/22/23   Edmonia Gottron, PA-C  rosuvastatin  (CRESTOR ) 10 MG tablet Take 1 tablet (10 mg total) by mouth daily. Patient not taking: Reported on 06/18/2023 03/02/23   Almira Jaeger, MD      Allergies    Bee pollen, Dust mite extract, Grass extracts [gramineae pollens], Hydrocodone -acetaminophen , and Pollen extract    Review of Systems   Review of Systems  All other systems reviewed and are negative.   Physical Exam Updated Vital Signs BP 128/82 (BP Location: Right Arm)   Pulse 89   Temp 98 F (36.7 C)   Resp 15   SpO2 96%  Physical Exam Vitals and nursing note reviewed.  Constitutional:      General: He is not in acute distress.    Appearance: He is well-developed. He is not diaphoretic.  HENT:     Head:  Normocephalic and atraumatic.  Cardiovascular:     Rate and Rhythm: Normal rate and regular rhythm.     Heart sounds: No murmur heard.    No friction rub.  Pulmonary:     Effort: Pulmonary effort is normal. No respiratory distress.     Breath sounds: Normal breath sounds. No wheezing or rales.  Abdominal:     General: Bowel sounds are normal. There is no distension.     Palpations: Abdomen is soft.     Tenderness: There is abdominal tenderness in the left lower quadrant. There is no right CVA tenderness, left CVA tenderness, guarding or rebound.  Musculoskeletal:        General: Normal range of motion.     Cervical back: Normal range of motion and neck supple.  Skin:    General: Skin is warm and dry.  Neurological:     Mental Status: He is alert and oriented to person, place, and time.     Coordination: Coordination normal.     ED Results / Procedures / Treatments   Labs (all labs ordered are listed, but only abnormal results are displayed) Labs Reviewed  CBC - Abnormal; Notable for the following components:      Result Value   WBC 11.4 (*)    All other components within normal limits  URINALYSIS, ROUTINE W REFLEX MICROSCOPIC -  Abnormal; Notable for the following components:   Ketones, ur 15 (*)    Leukocytes,Ua SMALL (*)    All other components within normal limits  LIPASE, BLOOD  COMPREHENSIVE METABOLIC PANEL WITH GFR    EKG None  Radiology No results found.  Procedures Procedures    Medications Ordered in ED Medications - No data to display  ED Course/ Medical Decision Making/ A&P  Patient is a 48 year old male with history of recurrent diverticulitis presenting with ongoing left lower quadrant pain despite being on Cipro  and Flagyl  for the past 5 days.  Patient arrives with stable vital signs and is afebrile.  He has tenderness to palpation in the left lower quadrant, but no peritoneal signs.  Laboratory studies obtained including CBC, CMP, and lipase.  His  white count is 11.4, down from previous studies and urinalysis is clear.  Patient has been hydrated with normal saline and given morphine  for pain.  Care discussed with Dr. Amy Kansky from the hospitalist service regarding possible admission, however he does not believe the patient meets admission criteria and that he can be discharged.  Patient was recently changed on Augmentin  and I will have him continue this.  I will also prescribe medication for pain and nausea and have him follow-up with his surgeon/gastroenterologist.  Final Clinical Impression(s) / ED Diagnoses Final diagnoses:  None    Rx / DC Orders ED Discharge Orders     None         Orvilla Blander, MD 06/24/23 0301

## 2023-06-24 NOTE — Plan of Care (Signed)
 [facility] Request for admit; declined pending reeval + ultimately discharged from ED   49 M with recurrent diverticulitis, recent initation of outpatient treatment for sigmoid diverticulitis with CT A/P 4/24 without complications. Had taken 1 week of Cipro  / Flagyl  and not improving. GI switched to Augmentin  on 4/28 with plan to complete additional 10 days of treatment. Presented to ED with worsening abd pain. Referral for admit for failed outpatient therapy. VS wnl. Labs with improving WBC 13 -> 11. No CT done in ED. Discussed with EDP that I would recommend for attempt at pain control, hydration in ED and assess ability to take PO. Asked EDP to discuss with the patient about his comfort going home and continuing Augmentin  vs coming in for IV Abx (And if patient uncomfortable going home that we could admit for IV Abx). Later spoke with EDP and patient has decided to go home.   Mark Larch, MD  [facility]

## 2023-06-24 NOTE — ED Notes (Signed)
 RN reviewed discharge instructions with pt. Pt verbalized understanding and had no further questions. VSS upon discharge.

## 2023-06-29 NOTE — Telephone Encounter (Signed)
 Finish Augmentin , will get repeat CT on Wednesday Stat, was improving but continues to have AB pain.

## 2023-06-30 ENCOUNTER — Other Ambulatory Visit (INDEPENDENT_AMBULATORY_CARE_PROVIDER_SITE_OTHER)

## 2023-06-30 DIAGNOSIS — R1032 Left lower quadrant pain: Secondary | ICD-10-CM

## 2023-06-30 LAB — CBC WITH DIFFERENTIAL/PLATELET
Basophils Absolute: 0 10*3/uL (ref 0.0–0.1)
Basophils Relative: 0.3 % (ref 0.0–3.0)
Eosinophils Absolute: 0.2 10*3/uL (ref 0.0–0.7)
Eosinophils Relative: 1.8 % (ref 0.0–5.0)
HCT: 40.5 % (ref 39.0–52.0)
Hemoglobin: 13.2 g/dL (ref 13.0–17.0)
Lymphocytes Relative: 17.9 % (ref 12.0–46.0)
Lymphs Abs: 2.2 10*3/uL (ref 0.7–4.0)
MCHC: 32.6 g/dL (ref 30.0–36.0)
MCV: 84.7 fl (ref 78.0–100.0)
Monocytes Absolute: 0.8 10*3/uL (ref 0.1–1.0)
Monocytes Relative: 6.4 % (ref 3.0–12.0)
Neutro Abs: 8.9 10*3/uL — ABNORMAL HIGH (ref 1.4–7.7)
Neutrophils Relative %: 73.6 % (ref 43.0–77.0)
Platelets: 351 10*3/uL (ref 150.0–400.0)
RBC: 4.78 Mil/uL (ref 4.22–5.81)
RDW: 13.9 % (ref 11.5–15.5)
WBC: 12.1 10*3/uL — ABNORMAL HIGH (ref 4.0–10.5)

## 2023-06-30 LAB — COMPREHENSIVE METABOLIC PANEL WITH GFR
ALT: 15 U/L (ref 0–53)
AST: 18 U/L (ref 0–37)
Albumin: 4.2 g/dL (ref 3.5–5.2)
Alkaline Phosphatase: 43 U/L (ref 39–117)
BUN: 14 mg/dL (ref 6–23)
CO2: 28 meq/L (ref 19–32)
Calcium: 9.5 mg/dL (ref 8.4–10.5)
Chloride: 102 meq/L (ref 96–112)
Creatinine, Ser: 1.11 mg/dL (ref 0.40–1.50)
GFR: 77.63 mL/min (ref >60.00)
Glucose, Bld: 87 mg/dL (ref 70–99)
Potassium: 4.2 meq/L (ref 3.5–5.1)
Sodium: 139 meq/L (ref 135–145)
Total Bilirubin: 0.5 mg/dL (ref 0.2–1.2)
Total Protein: 8 g/dL (ref 6.0–8.3)

## 2023-06-30 LAB — SEDIMENTATION RATE: Sed Rate: 52 mm/h — ABNORMAL HIGH (ref 0–20)

## 2023-07-03 ENCOUNTER — Encounter: Payer: Self-pay | Admitting: Family Medicine

## 2023-07-07 ENCOUNTER — Telehealth: Payer: Self-pay

## 2023-07-07 ENCOUNTER — Ambulatory Visit: Payer: Self-pay | Admitting: Family Medicine

## 2023-07-07 ENCOUNTER — Ambulatory Visit: Admitting: Family Medicine

## 2023-07-07 ENCOUNTER — Encounter: Payer: Self-pay | Admitting: Family Medicine

## 2023-07-07 VITALS — BP 112/72 | HR 65 | Temp 98.1°F | Ht 72.0 in | Wt 233.8 lb

## 2023-07-07 DIAGNOSIS — Z131 Encounter for screening for diabetes mellitus: Secondary | ICD-10-CM

## 2023-07-07 DIAGNOSIS — E785 Hyperlipidemia, unspecified: Secondary | ICD-10-CM

## 2023-07-07 DIAGNOSIS — K5792 Diverticulitis of intestine, part unspecified, without perforation or abscess without bleeding: Secondary | ICD-10-CM | POA: Diagnosis not present

## 2023-07-07 DIAGNOSIS — R1032 Left lower quadrant pain: Secondary | ICD-10-CM | POA: Diagnosis not present

## 2023-07-07 DIAGNOSIS — R102 Pelvic and perineal pain: Secondary | ICD-10-CM | POA: Diagnosis not present

## 2023-07-07 DIAGNOSIS — Z125 Encounter for screening for malignant neoplasm of prostate: Secondary | ICD-10-CM | POA: Diagnosis not present

## 2023-07-07 DIAGNOSIS — R300 Dysuria: Secondary | ICD-10-CM

## 2023-07-07 DIAGNOSIS — R7303 Prediabetes: Secondary | ICD-10-CM | POA: Diagnosis not present

## 2023-07-07 LAB — COMPREHENSIVE METABOLIC PANEL WITH GFR
ALT: 14 U/L (ref 0–53)
AST: 15 U/L (ref 0–37)
Albumin: 4.2 g/dL (ref 3.5–5.2)
Alkaline Phosphatase: 50 U/L (ref 39–117)
BUN: 17 mg/dL (ref 6–23)
CO2: 28 meq/L (ref 19–32)
Calcium: 9.3 mg/dL (ref 8.4–10.5)
Chloride: 102 meq/L (ref 96–112)
Creatinine, Ser: 1.05 mg/dL (ref 0.40–1.50)
GFR: 82.97 mL/min (ref >60.00)
Glucose, Bld: 76 mg/dL (ref 70–99)
Potassium: 4.1 meq/L (ref 3.5–5.1)
Sodium: 139 meq/L (ref 135–145)
Total Bilirubin: 0.4 mg/dL (ref 0.2–1.2)
Total Protein: 7.4 g/dL (ref 6.0–8.3)

## 2023-07-07 LAB — CBC WITH DIFFERENTIAL/PLATELET
Basophils Absolute: 0 10*3/uL (ref 0.0–0.1)
Basophils Relative: 0.4 % (ref 0.0–3.0)
Eosinophils Absolute: 0.2 10*3/uL (ref 0.0–0.7)
Eosinophils Relative: 2.4 % (ref 0.0–5.0)
HCT: 38.1 % — ABNORMAL LOW (ref 39.0–52.0)
Hemoglobin: 12.5 g/dL — ABNORMAL LOW (ref 13.0–17.0)
Lymphocytes Relative: 25 % (ref 12.0–46.0)
Lymphs Abs: 2.5 10*3/uL (ref 0.7–4.0)
MCHC: 32.9 g/dL (ref 30.0–36.0)
MCV: 84.2 fl (ref 78.0–100.0)
Monocytes Absolute: 0.6 10*3/uL (ref 0.1–1.0)
Monocytes Relative: 6.3 % (ref 3.0–12.0)
Neutro Abs: 6.7 10*3/uL (ref 1.4–7.7)
Neutrophils Relative %: 65.9 % (ref 43.0–77.0)
Platelets: 333 10*3/uL (ref 150.0–400.0)
RBC: 4.52 Mil/uL (ref 4.22–5.81)
RDW: 13.9 % (ref 11.5–15.5)
WBC: 10.1 10*3/uL (ref 4.0–10.5)

## 2023-07-07 LAB — PSA: PSA: 1.79 ng/mL (ref 0.10–4.00)

## 2023-07-07 LAB — C-REACTIVE PROTEIN: CRP: 5 mg/dL (ref 0.5–20.0)

## 2023-07-07 LAB — SEDIMENTATION RATE: Sed Rate: 89 mm/h — ABNORMAL HIGH (ref 0–20)

## 2023-07-07 LAB — LIPID PANEL
Cholesterol: 183 mg/dL (ref 0–200)
HDL: 32.5 mg/dL — ABNORMAL LOW (ref >39.00)
LDL Cholesterol: 119 mg/dL — ABNORMAL HIGH (ref 0–99)
NonHDL: 150
Total CHOL/HDL Ratio: 6
Triglycerides: 153 mg/dL — ABNORMAL HIGH (ref 0.0–149.0)
VLDL: 30.6 mg/dL (ref 0.0–40.0)

## 2023-07-07 LAB — TSH: TSH: 0.91 u[IU]/mL (ref 0.35–5.50)

## 2023-07-07 LAB — HEMOGLOBIN A1C: Hgb A1c MFr Bld: 6.3 % (ref 4.6–6.5)

## 2023-07-07 MED ORDER — AMOXICILLIN-POT CLAVULANATE 875-125 MG PO TABS: 1.0000 | ORAL_TABLET | Freq: Two times a day (BID) | ORAL | 0 refills | Status: AC

## 2023-07-07 NOTE — Progress Notes (Signed)
 Phone [phone] In person visit   Subjective:   Mark Alvarado is a 48 y.o. year old very pleasant male patient who presents for/with See problem oriented charting Chief Complaint  Patient presents with   discuss labs    Pt would like to discuss PSA and other lab results.    Past Medical History-  Patient Active Problem List   Diagnosis Date Noted   NSVT (nonsustained ventricular tachycardia) (HCC) 02/04/2021    Priority: Medium    Hyperlipidemia 12/28/2019    Priority: Medium    Essential hypertension 12/28/2019    Priority: Medium    History of DVT (deep vein thrombosis) 07/23/2017    Priority: Medium    Nonspecific abnormal electrocardiogram (ECG) (EKG) 02/04/2021    Priority: Low   Diverticulosis of colon without hemorrhage 03/29/2020    Priority: Low   Family hx of colon cancer 03/29/2020    Priority: Low   Allergic rhinitis 12/28/2019    Priority: Low   Childhood asthma 12/28/2019    Priority: Low   GERD (gastroesophageal reflux disease) 12/28/2019    Priority: Low   Heart murmur 12/28/2019    Priority: Low    Medications- reviewed and updated Current Outpatient Medications  Medication Sig Dispense Refill   acetaminophen  (TYLENOL ) 650 MG CR tablet Take 650 mg by mouth every 8 (eight) hours as needed for pain.     rosuvastatin  (CRESTOR ) 10 MG tablet Take 1 tablet (10 mg total) by mouth daily. 90 tablet 3   HYDROcodone -acetaminophen  (NORCO/VICODIN) 5-325 MG tablet Take 1-2 tablets by mouth every 6 (six) hours as needed. (Patient not taking: Reported on 07/07/2023) 15 tablet 0   No current facility-administered medications for this visit.     Objective:  BP 112/72   Pulse 65   Temp 98.1 F (36.7 C)   Ht 6' (1.829 m)   Wt 233 lb 12.8 oz (106.1 kg)   SpO2 97%   BMI 31.71 kg/m  Gen: NAD, resting comfortably CV: RRR no murmurs rubs or gallops Lungs: CTAB no crackles, wheeze, rhonchi Abdomen: soft/nontender except for moderate tenderness in left lower  quadrant and suprapubic areas/nondistended/normal bowel sounds. No rebound or guarding.  Ext: no edema and 2+ PT pulses Skin: warm, dry Rectal: normal tone, normal sized prostate, no masses and only minimal tenderness     Assessment and Plan      # Left lower quadrant pain with concern for smoldering sigmoid diverticulitis S:patient reached out on 07/03/23 about ongoing battle with diverticulitis (CT 06/18/23 uncomplicated diverticulitis of sigmoid colon"  under care of [facility] most recently - first visit was on 06/18/23- did round of ciprofloxacin  and flagyl  together then switched to Augmentin  on may 1st - was not noting improvement with ciprofloxacin /flagyl  but noted some improvement on the Augmentin  - has changed diet to liquid diet to try to help- ensure in morning and bone broth lunch and dinner- last night first time he had scrambled eggs and grits -sed rate down to 50's fro 70s and white count elevated but improved. Ketones in urine but diet has bene minimal - shards with his bowel movements- very small volume and will go to the bathroom feeling like he has to have a bowel movement but not able to have 1 or this very small volume will come out -no fever during this last month - ongoing abdominal pain throughout this- pain from 8/10 at its peak to more we only 2-4/10 and not neeidng hydrocodone  as much -no diarrhea  With these changes patient has lost over 20 pounds in 3 weeks.  On our scales he is down 27 pounds from January.  Could be dietary with the change in diet of course.  He also worried about prostate/bladder health-was noting some pain  with urination as well as central gut with peeing. Feels better after urinating- relieves abdominal pain- but also gets a separate abdominal pain when not urinating and gaseous pain. 1st stage of this was getting up hourly to urinate- that has been better. Symptoms started mid April- around 16th for both GI and bladder concerns -  with weight loss he is also also worried about diabetes   Prior history -similar episode 03/27/23 and  10/12/22 (diagnosed as smoldering and extended into September) and 06/04/21 -He had seen Dr. Marny Sires with [facility] on the surgery November 28, 2018 for for potential sigmoid colectomy but he wanted to have repeat colonoscopy first  -colonoscopy reassuring 01/06/23 other than severe diverticulosis without active diverticulitis in sigmoid colon-no sign of malignancy  A/P: Patient with nearly a months of left lower quadrant pain which has improved with antibiotics and bowel rest.  I was able to communicate with Dr. Leonia Raman during visit and she is concerned about smoldering diverticulitis with strong suggestion that he move forward with segmental sigmoid resection-with recent colonoscopy she is not concerned about colon cancer or IBD.  We will treat as below.  Established with cardiology but has not seen in about 3 years-we may provide cardiac clearance for him -Sister with colon cancer this is one of his main concerns but he was reassured by Dr. Allean Aran note -As for urinary symptoms we will get urine culture.  Prostate exam only mildly tender-I wonder if this could be referred pain with roughly adjacent sigmoid diverticulitis  From AVS "  Patient Instructions  From Dr. Arvel Birch has smoldering sigmoid diverticulitis, given he had multiple episodes he should get segmental sigmoid resection. Will send urgent referral to colorectal surgery for evaluation. His last colonoscopy was 6 months ago, no mass lesions and no features concerning for malignancy or IBD on on colonoscopy or imaging. Agree with antibiotics, he should stay on MiraLAX twice daily, continue liquid diet as tolerated and slowly advance to low residue diet. "  They have already entered referral to Dr. Joyce Nixon with [place] [facility] (she's a colorectal surgeon) connected with [facility] but in [place]- Phone: [phone] call  them if you don't hear within a week -if loose stools can hold miralax  Also when we asked your excellent question about antibiotic(s) "Agree, think we should do 14-day course of Augmentin  875 twice daily and repeat CT in 2 weeks to document improvement "  We will call you within two weeks about your referral for repeat CT (but you should finish antibiotics first so at least 15 days out)  through [facility].  Their phone number is [phone].  Please call them if you have not heard in 1-2 weeks  Recommended follow up: Return for as needed for new, worsening, persistent symptoms. Don't hesitate to call or reach out through mychart with quesoitns "  Recommended follow up: Return for as needed for new, worsening, persistent symptoms. Future Appointments  Date Time Provider Department Center  09/01/2023  9:00 AM Almira Jaeger, MD [facility]    Lab/Order associations: fasting   ICD-10-CM   1. Dysuria  R30.0 Urine Culture    2. Screening for prostate cancer  Z12.5 PSA    3. Hyperlipidemia, unspecified hyperlipidemia  type  E78.5 Comprehensive metabolic panel with GFR    CBC with Differential/Platelet    TSH    Lipid panel    4. Prediabetes  R73.03 HgB A1c    5. Screening for diabetes mellitus  Z13.1 HgB A1c    6. Suprapubic abdominal pain  R10.2 Sedimentation rate    C-reactive protein    7. LLQ abdominal pain  R10.32 Sedimentation rate    C-reactive protein    8. Left lower quadrant abdominal pain  R10.32 CT ABDOMEN PELVIS W CONTRAST      Meds ordered this encounter  Medications   amoxicillin -clavulanate (AUGMENTIN ) 875-125 MG tablet    Sig: Take 1 tablet by mouth 2 (two) times daily for 14 days.    Dispense:  28 tablet    Refill:  0    Time Spent: 45 minutes of total time (1:40 PM-2:20 PM, 5:07 PM-5:12 PM) was spent on the date of the encounter performing the following actions: chart review prior to seeing the patient, obtaining history, performing a  medically necessary exam, consulting with gastroenterology counseling on the treatment plan, placing orders, and documenting in our EHR.    Return precautions advised.  Clarisa Crooked, MD

## 2023-07-07 NOTE — Addendum Note (Signed)
 Addended by: Almira Jaeger on: 07/07/2023 06:17 PM   Modules accepted: Level of Service

## 2023-07-07 NOTE — Patient Instructions (Addendum)
 From Dr. Arvel Birch has smoldering sigmoid diverticulitis, given he had multiple episodes he should get segmental sigmoid resection. Will send urgent referral to colorectal surgery for evaluation. His last colonoscopy was 6 months ago, no mass lesions and no features concerning for malignancy or IBD on on colonoscopy or imaging. Agree with antibiotics, he should stay on MiraLAX twice daily, continue liquid diet as tolerated and slowly advance to low residue diet. "  They have already entered referral to Dr. Joyce Nixon with [facility] [place] [facility] (she's a colorectal surgeon) connected with [facility] but in [place]- Phone: [phone] call them if you don't hear within a week -if loose stools can hold miralax  Also when we asked your excellent question about antibiotic(s) "Agree, think we should do 14-day course of Augmentin  875 twice daily and repeat CT in 2 weeks to document improvement "  We will call you within two weeks about your referral for repeat CT (but you should finish antibiotics first so at least 15 days out)  through [facility].  Their phone number is [phone].  Please call them if you have not heard in 1-2 weeks  Recommended follow up: Return for as needed for new, worsening, persistent symptoms. Don't hesitate to call or reach out through [facility] with quesoitns

## 2023-07-07 NOTE — Telephone Encounter (Signed)
 urgent referral to Dr. Joyce Nixon to evaluate for possible sigmoid resection for recurrent sigmoid diverticulitis sent with faxed records per conversation below Dr Arlene Ben and Dr Leonia Raman below.      Dr. Leonia Raman- I am very worried about Mark Alvarado- 5 cases of diverticulitis since 2023 including on colonoscopy you did for him- almost always sigmoid. has been treated in last month with ciprofloxacin /flagyl  as well as Augmentin  but continued pain. mainly liquid diet but minimal stools- gets to toilet and feels like he can't void. Sister with colon cancer and died within 8 months- predisposition for men and boys she was told for that type of cancer. he also has elevated sed rate to 70s now trending down to 50's- I'm repeating that and adding crp. trying to see if you think he needs another colonoscopy with biopsies- could this be cancer, could this be IBD?- your team had mentioned possible repeat CT scan. I just really think we need to move quickly on him- he's down 27 lbs since January and I'm very concerned. appreciate you weighing in. think he could have prostate issue as well- I'm going to work that up/examine. there was consideration of referral to surgery but diverticulosis is extensive also per colonoscopy and not sure how much would need to be removed in that case.   7 mins KN Kavitha Nandigam V, MD He has smoldering sigmoid diverticulitis, given he had multiple episodes he should get segmental sigmoid resection. Will send urgent referral to colorectal surgery for evaluation. His last colonoscopy was 6 months ago, no mass lesions and no features concerning for malignancy or IBD on on colonoscopy or imaging. Agree with antibiotics, he should stay on MiraLAX twice daily, continue liquid diet as tolerated and slowly advance to low residue diet.  3 mins You were added by Sergio Dandy, MD. 2 mins KN Sergio Dandy, MD [name], can you please send urgent referral to Dr. Joyce Nixon to  evaluate for possible sigmoid resection for recurrent sigmoid diverticulitis? Thank you  1  2 mins SH Almira Jaeger, MD should I do another round of Augmentin ? he seemed to get some relief on that- none on cpiro/flagyl . REALLY appreciate your input- so helpful

## 2023-07-08 LAB — URINE CULTURE
MICRO NUMBER:: 16448913
Result:: NO GROWTH
SPECIMEN QUALITY:: ADEQUATE

## 2023-07-08 NOTE — Telephone Encounter (Signed)
 Pt reviewed results through MyChart. Last read by Novella Begin at 2:19PM on 07/08/2023.

## 2023-07-15 ENCOUNTER — Other Ambulatory Visit

## 2023-07-22 ENCOUNTER — Ambulatory Visit
Admission: RE | Admit: 2023-07-22 | Discharge: 2023-07-22 | Disposition: A | Discharge status: Home / Self Care | Source: Ambulatory Visit | Attending: Family Medicine | Admitting: Family Medicine

## 2023-07-22 DIAGNOSIS — R1032 Left lower quadrant pain: Secondary | ICD-10-CM

## 2023-07-22 MED ORDER — IOPAMIDOL (ISOVUE-300) INJECTION 61%
100.0000 mL | Freq: Once | INTRAVENOUS | Status: AC | PRN
Start: 2023-07-22 — End: 2023-07-22
  Administered 2023-07-22: 100 mL via INTRAVENOUS

## 2023-07-29 ENCOUNTER — Ambulatory Visit: Payer: Self-pay | Admitting: General Surgery

## 2023-07-29 DIAGNOSIS — K5732 Diverticulitis of large intestine without perforation or abscess without bleeding: Secondary | ICD-10-CM | POA: Diagnosis not present

## 2023-07-29 NOTE — H&P (Signed)
 REFERRING PHYSICIAN:  Santina Cull., PA   PROVIDER:  Denese Finn, MD   MRN: [id number] DOB: 1975-12-03 DATE OF ENCOUNTER: 07/29/2023   Subjective    Chief Complaint: No chief complaint on file.       History of Present Illness:   .  Patient with multiple episodes of recurrent uncomplicated diverticulitis of the sigmoid colon over the past 2 years.   He was seen in the office prior to that by Dr. Marny Sires and surgery was recommended.  Patient wanted to obtain another colonoscopy.  This was completed in November 2024 and showed severe diverticulosis throughout the colon.  Throughout the entire beginning of this year he has had multiple episodes of diverticulitis including the most recent episode that continued for about 8 weeks in April and May.  He is now feeling better and is here to discuss surgery in more detail.     Review of Systems: A complete review of systems was obtained from the patient.  I have reviewed this information and discussed as appropriate with the patient.  See HPI as well for other ROS.   Medical History: Past Medical History      Past Medical History:  Diagnosis Date   Diabetes mellitus without complication (CMS/HHS-HCC)     GERD (gastroesophageal reflux disease)     Hyperlipidemia     Hypertension     NSVT (nonsustained ventricular tachycardia) (CMS/HHS-HCC)          Problem List     Patient Active Problem List  Diagnosis   Essential hypertension   Heart murmur   History of DVT (deep vein thrombosis)        Past Surgical History       Past Surgical History:  Procedure Laterality Date   ARTHROSCOPIC ROTATOR CUFF REPAIR Left     ARTHROSCOPIC ROTATOR CUFF REPAIR Right     BUNION CORRECTION       COLONOSCOPY       knee arthroscopic Left     KNEE ARTHROSCOPY            Allergies  No Known Allergies     Medications Ordered Prior to Encounter        Current Outpatient Medications on File Prior to Visit  Medication Sig  Dispense Refill   amoxicillin -clavulanate (AUGMENTIN ) 875-125 mg tablet Take 1 tablet by mouth 2 (two) times daily (Patient not taking: Reported on 07/29/2023)       dicyclomine  (BENTYL ) 20 mg tablet Take by mouth (Patient not taking: Reported on 07/29/2023)       HYDROcodone -acetaminophen  (NORCO) 5-325 mg tablet  (Patient not taking: Reported on 07/29/2023)       polyethylene glycol (MIRALAX) powder Take 17 g by mouth once daily (Patient not taking: Reported on 07/29/2023)        No current facility-administered medications on file prior to visit.        Family History       Family History  Problem Relation Age of Onset   Hyperlipidemia (Elevated cholesterol) Mother     High blood pressure (Hypertension) Mother     Hyperlipidemia (Elevated cholesterol) Father     High blood pressure (Hypertension) Father     Colon cancer Sister     Colon cancer Maternal Aunt     Colon cancer Paternal Uncle     Colon cancer Maternal Grandmother     Coronary Artery Disease (Blocked arteries around heart) Paternal Grandfather          Tobacco  Use History  Social History       Tobacco Use  Smoking Status Never  Smokeless Tobacco Not on file        Social History  Social History        Socioeconomic History   Marital status: Married  Tobacco Use   Smoking status: Never  Vaping Use   Vaping status: Never Used  Substance and Sexual Activity   Alcohol use: Yes   Drug use: Never    Social Drivers of Acupuncturist Strain: Low Risk  (02/23/2023)    Received from [facility]    Overall Financial Resource Strain (CARDIA)     Difficulty of Paying Living Expenses: Not hard at all  Food Insecurity: No Food Insecurity (02/23/2023)    Received from [facility]    Hunger Vital Sign     Worried About Running Out of Food in the Last Year: Never true     Ran Out of Food in the Last Year: Never true  Transportation Needs: No Transportation Needs (02/23/2023)    Received from [facility] - Transportation     Lack of Transportation (Medical): No     Lack of Transportation (Non-Medical): No  Physical Activity: Unknown (02/23/2023)    Received from [facility]    Exercise Vital Sign     Days of Exercise per Week: 0 days  Stress: No Stress Concern Present (02/23/2023)    Received from [facility] - Occupational Stress Questionnaire     Feeling of Stress : Not at all  Social Connections: Socially Integrated (02/23/2023)    Received from [facility]    Social Connection and Isolation Panel [NHANES]     Frequency of Communication with Friends and Family: More than three times a week     Frequency of Social Gatherings with Friends and Family: Once a week     Attends Religious Services: More than 4 times per year     Active Member of [employer] or Organizations: Yes     Attends Banker Meetings: 1 to 4 times per year     Marital Status: Married  Housing Stability: Unknown (07/29/2023)    Housing Stability Vital Sign     Homeless in the Last Year: No        Objective:           Vitals:    07/29/23 1043 07/29/23 1044 07/29/23 1123  BP:     122/86  Pulse: 104      Temp: 36.4 C (97.5 F)      SpO2: 99%      Weight: (!) 108.2 kg (238 lb 9.6 oz)      PainSc:   0-No pain        Exam Gen: NAD Abd: soft     Labs, Imaging and Diagnostic Testing: CT and colonoscopy report and images reviewed   Assessment and Plan:  Diagnoses and all orders for this visit:   Diverticulitis of large intestine without perforation or abscess without bleeding   Other orders -     polyethylene glycol (MIRALAX) powder; Take 233.75 g by mouth once for 1 dose Take according to your procedure prep instructions. -     bisacodyL (DULCOLAX) 5 mg EC tablet; Take 4 tablets (20 mg total) by mouth once daily as needed for Constipation for up to 1 dose -  metroNIDAZOLE  (FLAGYL ) 500 MG tablet; Take 2 tablets (1,000 mg total) by  mouth 3 (three) times daily for 3 doses Take according to your procedure colon prep instructions -     neomycin 500 mg tablet; Take 2 tablets (1,000 mg total) by mouth 3 (three) times daily for 3 doses Take according to your procedure colon prep instructions -     ondansetron  (ZOFRAN ) 4 MG tablet; Take 1 tablet (4 mg total) by mouth 2 (two) times daily Take 1 tablet at 3pm and then 1 tablet at 63pm   48 year old male with recurrent versus smoldering diverticulitis.  We discussed that surgical resection is really the only way to cure this.  We discussed proceeding with surgery in late July or early August.  All questions were answered.  They agree to proceed. The surgery and anatomy were described to the patient as well as the risks of surgery and the possible complications.  These include: Bleeding, deep abdominal infections and possible wound complications such as hernia and infection, damage to adjacent structures, leak of surgical connections, which can lead to other surgeries and possibly an ostomy, possible need for other procedures, such as abscess drains in radiology, possible prolonged hospital stay, possible diarrhea from removal of part of the colon, possible constipation from narcotics, possible bowel, bladder or sexual dysfunction if having rectal surgery, prolonged fatigue/weakness or appetite loss, possible early recurrence of of disease, possible complications of their medical problems such as heart disease or arrhythmias or lung problems, death (less than 1%). I believe the patient understands and wishes to proceed with the surgery.    Fernande Howells, MD Colon and Rectal Surgery [facility]

## 2023-09-01 ENCOUNTER — Ambulatory Visit (INDEPENDENT_AMBULATORY_CARE_PROVIDER_SITE_OTHER): Payer: BC Managed Care – PPO | Admitting: Family Medicine

## 2023-09-01 ENCOUNTER — Ambulatory Visit: Payer: Self-pay | Admitting: Family Medicine

## 2023-09-01 ENCOUNTER — Encounter: Payer: Self-pay | Admitting: Family Medicine

## 2023-09-01 VITALS — BP 110/80 | HR 81 | Temp 98.1°F | Ht 72.0 in | Wt 241.0 lb

## 2023-09-01 DIAGNOSIS — E785 Hyperlipidemia, unspecified: Secondary | ICD-10-CM | POA: Diagnosis not present

## 2023-09-01 DIAGNOSIS — Z0000 Encounter for general adult medical examination without abnormal findings: Secondary | ICD-10-CM | POA: Diagnosis not present

## 2023-09-01 DIAGNOSIS — Z125 Encounter for screening for malignant neoplasm of prostate: Secondary | ICD-10-CM | POA: Diagnosis not present

## 2023-09-01 DIAGNOSIS — R7303 Prediabetes: Secondary | ICD-10-CM | POA: Diagnosis not present

## 2023-09-01 DIAGNOSIS — Z131 Encounter for screening for diabetes mellitus: Secondary | ICD-10-CM

## 2023-09-01 DIAGNOSIS — D649 Anemia, unspecified: Secondary | ICD-10-CM | POA: Diagnosis not present

## 2023-09-01 LAB — COMPREHENSIVE METABOLIC PANEL WITH GFR
ALT: 17 U/L (ref 0–53)
AST: 22 U/L (ref 0–37)
Albumin: 4.3 g/dL (ref 3.5–5.2)
Alkaline Phosphatase: 46 U/L (ref 39–117)
BUN: 17 mg/dL (ref 6–23)
CO2: 27 meq/L (ref 19–32)
Calcium: 9.1 mg/dL (ref 8.4–10.5)
Chloride: 103 meq/L (ref 96–112)
Creatinine, Ser: 1.01 mg/dL (ref 0.40–1.50)
GFR: 86.83 mL/min (ref >60.00)
Glucose, Bld: 80 mg/dL (ref 70–99)
Potassium: 4.9 meq/L (ref 3.5–5.1)
Sodium: 139 meq/L (ref 135–145)
Total Bilirubin: 0.5 mg/dL (ref 0.2–1.2)
Total Protein: 7.4 g/dL (ref 6.0–8.3)

## 2023-09-01 LAB — IBC + FERRITIN
Ferritin: 174.3 ng/mL (ref 22.0–322.0)
Iron: 67 ug/dL (ref 42–165)
Saturation Ratios: 20.1 % (ref 20.0–50.0)
TIBC: 333.2 ug/dL (ref 250.0–450.0)
Transferrin: 238 mg/dL (ref 212.0–360.0)

## 2023-09-01 LAB — CBC WITH DIFFERENTIAL/PLATELET
Basophils Absolute: 0.1 K/uL (ref 0.0–0.1)
Basophils Relative: 0.6 % (ref 0.0–3.0)
Eosinophils Absolute: 0.2 K/uL (ref 0.0–0.7)
Eosinophils Relative: 1.6 % (ref 0.0–5.0)
HCT: 40.1 % (ref 39.0–52.0)
Hemoglobin: 12.7 g/dL — ABNORMAL LOW (ref 13.0–17.0)
Lymphocytes Relative: 23 % (ref 12.0–46.0)
Lymphs Abs: 2.5 K/uL (ref 0.7–4.0)
MCHC: 31.7 g/dL (ref 30.0–36.0)
MCV: 83.8 fl (ref 78.0–100.0)
Monocytes Absolute: 0.5 K/uL (ref 0.1–1.0)
Monocytes Relative: 4.9 % (ref 3.0–12.0)
Neutro Abs: 7.7 K/uL (ref 1.4–7.7)
Neutrophils Relative %: 69.9 % (ref 43.0–77.0)
Platelets: 275 K/uL (ref 150.0–400.0)
RBC: 4.78 Mil/uL (ref 4.22–5.81)
RDW: 15.4 % (ref 11.5–15.5)
WBC: 11 K/uL — ABNORMAL HIGH (ref 4.0–10.5)

## 2023-09-01 LAB — PSA: PSA: 1.73 ng/mL (ref 0.10–4.00)

## 2023-09-01 NOTE — Progress Notes (Signed)
 Phone: [phone]   Subjective:  Patient presents today for their annual physical. Chief complaint-noted.   See problem oriented charting- ROS- full  review of systems was completed and negative  Per full ROS sheet completed by patient except for topics noted under acute/chronic concerns- abdomen has been better lately  The following were reviewed and entered/updated in epic: Past Medical History:  Diagnosis Date   Allergy    Asthma    As a child   Chronic constipation    Diverticulitis    Diverticulosis    DVT (deep venous thrombosis) (HCC)    Hyperlipidemia    Hypertension    Lactose intolerance    Patient Active Problem List   Diagnosis Date Noted   NSVT (nonsustained ventricular tachycardia) (HCC) 02/04/2021    Priority: Medium    Hyperlipidemia 12/28/2019    Priority: Medium    Essential hypertension 12/28/2019    Priority: Medium    History of DVT (deep vein thrombosis) 07/23/2017    Priority: Medium    Nonspecific abnormal electrocardiogram (ECG) (EKG) 02/04/2021    Priority: Low   Diverticulosis of colon without hemorrhage 03/29/2020    Priority: Low   Family hx of colon cancer 03/29/2020    Priority: Low   Allergic rhinitis 12/28/2019    Priority: Low   Childhood asthma 12/28/2019    Priority: Low   GERD (gastroesophageal reflux disease) 12/28/2019    Priority: Low   Heart murmur 12/28/2019    Priority: Low   Past Surgical History:  Procedure Laterality Date   COLONOSCOPY     left knee arthroscopic     1994- football injury    ROTATOR CUFF REPAIR Right    march 2019   ROTATOR CUFF REPAIR Left    Partial and AC joint repair december 2020    Family History  Problem Relation Age of Onset   High Cholesterol Mother    Hypertension Mother    High Cholesterol Father    Hypertension Father    Kidney disease Father        agent orange exposure- 100% disability   COPD Father    Colon cancer Sister        rare and typically find in men and boys-  died in 8 months.    Cancer Sister    Colon cancer Maternal Aunt    Colon cancer Maternal Grandmother    Alzheimer's disease Paternal Grandmother    Healthy Sister    Other Maternal Grandfather        accidental   Heart attack Paternal Grandfather        early 64s   Colon cancer Paternal Uncle    Pancreatic cancer Neg Hx    Esophageal cancer Neg Hx    Stomach cancer Neg Hx    Liver disease Neg Hx     Medications- reviewed and updated Current Outpatient Medications  Medication Sig Dispense Refill   acetaminophen  (TYLENOL ) 650 MG CR tablet Take 650 mg by mouth every 8 (eight) hours as needed for pain.     HYDROcodone -acetaminophen  (NORCO/VICODIN) 5-325 MG tablet Take 1-2 tablets by mouth every 6 (six) hours as needed. 15 tablet 0   rosuvastatin  (CRESTOR ) 10 MG tablet Take 1 tablet (10 mg total) by mouth daily. 90 tablet Mark   No current facility-administered medications for this visit.    Allergies-reviewed and updated Allergies  Allergen Reactions   Bee Pollen     Other Reaction(s): Cough   Dust Mite Extract  Grass Extracts [Gramineae Pollens]    Hydrocodone -Acetaminophen  Other (See Comments)    Lethargic, felt out of body   Pollen Extract     Social History   Social History Narrative   Married 2008. Omega (2014), Evan (2017). Mother in law lives with them.       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 Football for 2 years and was on track team   Doctorate [employer]- global sport leadership- develops international skills but ended  Up with [employer] position   Was Engineering geologist at [employer]: time with family and sons, walking in mornings, date night once a week with wife, church   Objective  Objective:  BP 110/80   Pulse 81   Temp 98.1 F (36.7 C)   Ht 6' (1.829 m)   Wt 241 lb (109.Mark kg)    SpO2 98%   BMI 32.69 kg/m  Gen: NAD, resting comfortably HEENT: Mucous membranes are moist. Oropharynx normal Neck: no thyromegaly CV: RRR no murmurs rubs or gallops Lungs: CTAB no crackles, wheeze, rhonchi Abdomen: soft/nontender/nondistended/normal bowel sounds. No rebound or guarding.  Ext: no edema Skin: warm, dry Neuro: grossly normal, moves all extremities, PERRLA   Assessment and Plan  48 y.o. Alvarado presenting for annual physical.  Health Maintenance counseling: 1. Anticipatory guidance: Patient counseled regarding regular dental exams -q6 months, eye exams -[facility] yearly,  avoiding smoking and second hand smoke , limiting alcohol to 2 beverages per day - sparing alcohol, no illicit drugs .   2. Risk factor reduction:  Advised patient of need for regular exercise and diet rich and fruits and vegetables to reduce risk of heart attack and stroke.  Exercise- walking briskly and even running on treadmill and able to do weights agin.  Diet/weight management-  Has desired his long-term weight to get around 220.  Peak weight of 267 at home. Weight up some but had vacation for 8 days. Steady progress Wt Readings from Last Mark Encounters:  09/01/23 241 lb (109.Mark kg)  07/07/23 233 lb 12.8 oz (106.1 kg)  06/18/23 247 lb (112 kg)  Mark. Immunizations/screenings/ancillary studies- had HBV vaccines as child. Declines COVID, shingles, Prevnar 20 - later date for these Immunization History  Administered Date(s) Administered   Influenza, Seasonal, Injecte, Preservative Fre 02/26/2023   Influenza,inj,Quad PF,6+ Mos 12/28/2019, 11/07/2021   Influenza-Unspecified 12/12/2020   Moderna SARS-COV2 Booster Vaccination 02/01/2020   Moderna Sars-Covid-2 Vaccination 04/28/2019, 05/31/2019   Pfizer Covid-19 Vaccine Bivalent Booster 73yrs & up 12/12/2020   Tdap 09/25/2014  4. Prostate cancer screening- up some on last check- recheck today to make sure not trending up more  Lab Results  Component  Value Date   PSA 1.79 07/07/2023   PSA 1.50 08/27/2022   PSA 1.19 04/26/2021   5. Colon cancer screening - 01/06/23 and has upcoming partial collectomy for recurrent diverticulitis  6. Skin cancer screening- lower risk due to melanin content. advised regular sunscreen use. Denies worrisome, changing, or new skin lesions.  7. Smoking associated screening (lung cancer screening, AAA screen 65-75, UA)- former smoker 8. STD screening - only active with wife  Status of chronic or acute concerns   # Recurrent smoldering diverticulitis-patient with planned partial colectomy with Dr. Debby on 09/23/2023  -able to run on treadmill for up to 30 minutes without chest  pain or shortness of breath - able to easily complete 4 METS of activity without chest pain or shortness of breath  -has been careful with diet  #Anemia related to diverticulitis- check CBC and iron  panel  Lab Results  Component Value Date   WBC 10.1 07/07/2023   HGB 12.5 (L) 07/07/2023   HCT 38.1 (L) 07/07/2023   MCV 84.2 07/07/2023   PLT 333.0 07/07/2023   #hyperlipidemia-in 2022 had nonobstructive plaque in the RCA and mild disease in the LAD-most recently saw cardiology 02/06/2021 Dr. Lavona -Prior CT calcium  scoring of 5 at 82nd percentile 01/09/2021 S: Medication:Rosuvastatin  10 mg daily with LDL down over 40 points from peak of 163 -Reassuring echocardiogram in the past.  Abnormal EKG from repolarization Lab Results  Component Value Date   CHOL 183 07/07/2023   HDL 32.50 (L) 07/07/2023   LDLCALC 119 (H) 07/07/2023   TRIG 153.0 (H) 07/07/2023   CHOLHDL 6 07/07/2023  A/P: LDL improved over 40 points and wants to continue to work on lifestyle and continue current medications - we can reassess next year- I don't think its a rush to get this down more rapidly  -still does vegetarian diet- in long run would want him to take multivitamin   #hypertension-has been able to control with lifestyle changes/weight loss S:  medication: none BP Readings from Last Mark Encounters:  09/01/23 110/80  07/07/23 112/72  06/24/23 114/72  A/P: well controlled without medicine- continue to monitor and continue to work on lifestyle changes   # Hyperglycemia/insulin resistance/prediabetes-peak A1c 6.Mark most recently - working on lifestyle to reduce risk   # History of NSVT-resolved with stopping caffeine-prior palpitations   # High-frequency hearing loss-has seen audiology in 2022 with plan for repeat scans every 2 years- no worsening and wants to wait until next year   #some runny nose/congestion since 23rd. Wakes up with dry throat. Using humidifier. Cough improving. Feels allergies contributing- mucinex wasn't helping. Hasn't tried allergy perspective  other than benadryl but dried him out- advised claritin or allegra trial for next 2 weeks  Recommended follow up: Return in about 6 months (around 03/03/2024) for followup or sooner if needed.Schedule b4 you leave. Future Appointments  Date Time Provider Department Center  09/18/2023  8:00 AM [facility] PAT 1 [facility] None   Lab/Order associations: fasting   ICD-10-CM   1. Preventative health care  Z00.00     2. Prediabetes  R73.03     Mark. Screening for diabetes mellitus  Z13.1     4. Screening for prostate cancer  Z12.5 PSA    5. Hyperlipidemia, unspecified hyperlipidemia type  E78.5 CBC with Differential/Platelet    Comprehensive metabolic panel with GFR    6. Anemia, unspecified type  D64.9 IBC + Ferritin      No orders of the defined types were placed in this encounter.   Return precautions advised.  Garnette Lukes, MD

## 2023-09-01 NOTE — Patient Instructions (Addendum)
 advised claritin or allegra trial for next 2 weeks  I wish you the best with surgery and recovery !  Please stop by lab before you go If you have mychart- we will send your results within 3 business days of us  receiving them.  If you do not have mychart- we will call you about results within 5 business days of us  receiving them.  *please also note that you will see labs on mychart as soon as they post. I will later go in and write notes on them- will say notes from Dr. Katrinka   Recommended follow up: Return in about 6 months (around 03/03/2024) for followup or sooner if needed.Schedule b4 you leave.

## 2023-09-02 DIAGNOSIS — R490 Dysphonia: Secondary | ICD-10-CM | POA: Diagnosis not present

## 2023-09-02 DIAGNOSIS — R0982 Postnasal drip: Secondary | ICD-10-CM | POA: Diagnosis not present

## 2023-09-02 DIAGNOSIS — J029 Acute pharyngitis, unspecified: Secondary | ICD-10-CM | POA: Diagnosis not present

## 2023-09-17 NOTE — Patient Instructions (Signed)
 SURGICAL WAITING ROOM VISITATION Patients having surgery or a procedure may have no more than 2 support people in the waiting area - these visitors may rotate in the visitor waiting room.   Due to an increase in RSV and influenza rates and associated hospitalizations, children ages 48 and under may not visit patients in [facility] hospitals. If the patient needs to stay at the hospital during part of their recovery, the visitor guidelines for inpatient rooms apply.  PRE-OP VISITATION  Pre-op nurse will coordinate an appropriate time for 1 support person to accompany the patient in pre-op.  This support person may not rotate.  This visitor will be contacted when the time is appropriate for the visitor to come back in the pre-op area.  Please refer to the [facility] website for the visitor guidelines for Inpatients (after your surgery is over and you are in a regular room).  You are not required to quarantine at this time prior to your surgery. However, you must do this: Hand Hygiene often Do NOT share personal items Notify your provider if you are in close contact with someone who has COVID or you develop fever 100.4 or greater, new onset of sneezing, cough, sore throat, shortness of breath or body aches.  If you test positive for Covid or have been in contact with anyone that has tested positive in the last 10 days please notify you surgeon.    Your procedure is scheduled on:  09/23/23  Report to [facility] Main Entrance: [place] entrance where the [employer] is available.   Report to admitting at:  6:15 AM  Call this number if you have any questions or problems the morning of surgery [phone]  FOLLOW ANY ADDITIONAL PRE OP INSTRUCTIONS YOU RECEIVED FROM YOUR SURGEON'S OFFICE!!!  Clear liquids diet starting the day before surgery until: 5:30 AM DAY OF SURGERY. Drink plenty clear liquids the day before surgery.  Clear Liquid Diet Water Black Coffee (sugar ok, NO  MILK/CREAM OR CREAMERS)  Tea (sugar ok, NO MILK/CREAM OR CREAMERS) regular and decaf                             Plain Jell-O  with no fruit (NO RED)                                           Fruit ices (not with fruit pulp, NO RED)                                     Popsicles (NO RED)                                                                  Juice: NO CITRUS JUICES: only apple, WHITE grape, WHITE cranberry Sports drinks like Gatorade or Powerade (NO RED)       Drink 2 Ensure drinks AT 10:00 PM the night before surgery.    The day of surgery:  Drink ONE (1) Pre-Surgery Clear Ensure at : 5:30 AM the morning  of surgery. Drink in one sitting. Do not sip.  This drink was given to you during your hospital pre-op appointment visit. Nothing else to drink after completing the Pre-Surgery Clear Ensure or G2 : No candy, chewing gum or throat lozenges.    Oral Hygiene is also important to reduce your risk of infection.        Remember - BRUSH YOUR TEETH THE MORNING OF SURGERY WITH YOUR REGULAR TOOTHPASTE  Do NOT smoke after Midnight the night before surgery.  STOP TAKING all Vitamins, Herbs and supplements 1 week before your surgery.   Take ONLY these medicines the morning of surgery with A SIP OF WATER: NONE  If You have been diagnosed with Sleep Apnea - Bring CPAP mask and tubing day of surgery. We will provide you with a CPAP machine on the day of your surgery.                   You may not have any metal on your body including hair pins, jewelry, and body piercing  Do not wear lotions, powders, perfumes / cologne, or deodorant   Men may shave face and neck.  Contacts, Hearing Aids, dentures or bridgework may not be worn into surgery. DENTURES WILL BE REMOVED PRIOR TO SURGERY PLEASE DO NOT APPLY Poly grip OR ADHESIVES!!!  You may bring a small overnight bag with you on the day of surgery, only pack items that are not valuable. [facility] IS NOT RESPONSIBLE   FOR VALUABLES THAT  ARE LOST OR STOLEN.   Patients discharged on the day of surgery will not be allowed to drive home.  Someone NEEDS to stay with you for the first 24 hours after anesthesia.  Do not bring your home medications to the hospital. The Pharmacy will dispense medications listed on your medication list to you during your admission in the Hospital.  Special Instructions: Bring a copy of your healthcare power of attorney and living will documents the day of surgery, if you wish to have them scanned into your [facility] Records- EPIC  Please read over the following fact sheets you were given: IF YOU HAVE QUESTIONS ABOUT YOUR PRE-OP INSTRUCTIONS, PLEASE CALL [phone]   [facility] - Preparing for Surgery Before surgery, you can play an important role.  Because skin is not sterile, your skin needs to be as free of germs as possible.  You can reduce the number of germs on your skin by washing with CHG (chlorahexidine gluconate) soap before surgery.  CHG is an antiseptic cleaner which kills germs and bonds with the skin to continue killing germs even after washing. Please DO NOT use if you have an allergy to CHG or antibacterial soaps.  If your skin becomes reddened/irritated stop using the CHG and inform your nurse when you arrive at Short Stay. Do not shave (including legs and underarms) for at least 48 hours prior to the first CHG shower.  You may shave your face/neck.  Please follow these instructions carefully:  1.  Shower with CHG Soap the night before surgery and the  morning of surgery.  2.  If you choose to wash your hair, wash your hair first as usual with your normal  shampoo.  3.  After you shampoo, rinse your hair and body thoroughly to remove the shampoo.                             4.  Use CHG as you would any other liquid soap.  You can apply chg directly to the skin and wash.  Gently with a scrungie or clean washcloth.  5.  Apply the CHG Soap to your body ONLY FROM THE NECK DOWN.    Do not use on face/ open                           Wound or open sores. Avoid contact with eyes, ears mouth and genitals (private parts).                       Wash face,  Genitals (private parts) with your normal soap.             6.  Wash thoroughly, paying special attention to the area where your  surgery  will be performed.  7.  Thoroughly rinse your body with warm water from the neck down.  8.  DO NOT shower/wash with your normal soap after using and rinsing off the CHG Soap.            9.  Pat yourself dry with a clean towel.            10.  Wear clean pajamas.            11.  Place clean sheets on your bed the night of your first shower and do not  sleep with pets.  ON THE DAY OF SURGERY : Do not apply any lotions/deodorants the morning of surgery.  Please wear clean clothes to the hospital/surgery center.     FAILURE TO FOLLOW THESE INSTRUCTIONS MAY RESULT IN THE CANCELLATION OF YOUR SURGERY  PATIENT SIGNATURE_________________________________  NURSE SIGNATURE__________________________________  ________________________________________________________________________    Mark Alvarado  An incentive spirometer is a tool that can help keep your lungs clear and active. This tool measures how well you are filling your lungs with each breath. Taking long deep breaths may help reverse or decrease the chance of developing breathing (pulmonary) problems (especially infection) following: A long period of time when you are unable to move or be active. BEFORE THE PROCEDURE  If the spirometer includes an indicator to show your best effort, your nurse or respiratory therapist will set it to a desired goal. If possible, sit up straight or lean slightly forward. Try not to slouch. Hold the incentive spirometer in an upright position. INSTRUCTIONS FOR USE  Sit on the edge of your bed if possible, or sit up as far as you can in bed or on a chair. Hold the incentive spirometer in an  upright position. Breathe out normally. Place the mouthpiece in your mouth and seal your lips tightly around it. Breathe in slowly and as deeply as possible, raising the piston or the ball toward the top of the column. Hold your breath for 3-5 seconds or for as long as possible. Allow the piston or ball to fall to the bottom of the column. Remove the mouthpiece from your mouth and breathe out normally. Rest for a few seconds and repeat Steps 1 through 7 at least 10 times every 1-2 hours when you are awake. Take your time and take a few normal breaths between deep breaths. The spirometer may include an indicator to show your best effort. Use the indicator as a goal to work toward during each repetition. After each set of 10 deep breaths, practice coughing to be sure your lungs are clear. If  you have an incision (the cut made at the time of surgery), support your incision when coughing by placing a pillow or rolled up towels firmly against it. Once you are able to get out of bed, walk around indoors and cough well. You may stop using the incentive spirometer when instructed by your caregiver.  RISKS AND COMPLICATIONS Take your time so you do not get dizzy or light-headed. If you are in pain, you may need to take or ask for pain medication before doing incentive spirometry. It is harder to take a deep breath if you are having pain. AFTER USE Rest and breathe slowly and easily. It can be helpful to keep track of a log of your progress. Your caregiver can provide you with a simple table to help with this. If you are using the spirometer at home, follow these instructions: SEEK MEDICAL CARE IF:  You are having difficultly using the spirometer. You have trouble using the spirometer as often as instructed. Your pain medication is not giving enough relief while using the spirometer. You develop fever of 100.5 F (38.1 C) or higher. SEEK IMMEDIATE MEDICAL CARE IF:  You cough up bloody sputum that had  not been present before. You develop fever of 102 F (38.9 C) or greater. You develop worsening pain at or near the incision site. MAKE SURE YOU:  Understand these instructions. Will watch your condition. Will get help right away if you are not doing well or get worse. Document Released: 06/23/2006 Document Revised: 05/05/2011 Document Reviewed: 08/24/2006 ExitCare Patient Information 2014 ExitCare, [place].   ________________________________________________________________________ WHAT IS A BLOOD TRANSFUSION? Blood Transfusion Information  A transfusion is the replacement of blood or some of its parts. Blood is made up of multiple cells which provide different functions. Red blood cells carry oxygen and are used for blood loss replacement. White blood cells fight against infection. Platelets control bleeding. Plasma helps clot blood. Other blood products are available for specialized needs, such as hemophilia or other clotting disorders. BEFORE THE TRANSFUSION  Who gives blood for transfusions?  Healthy volunteers who are fully evaluated to make sure their blood is safe. This is blood bank blood. Transfusion therapy is the safest it has ever been in the practice of medicine. Before blood is taken from a donor, a complete history is taken to make sure that person has no history of diseases nor engages in risky social behavior (examples are intravenous drug use or sexual activity with multiple partners). The donor's travel history is screened to minimize risk of transmitting infections, such as malaria. The donated blood is tested for signs of infectious diseases, such as HIV and hepatitis. The blood is then tested to be sure it is compatible with you in order to minimize the chance of a transfusion reaction. If you or a relative donates blood, this is often done in anticipation of surgery and is not appropriate for emergency situations. It takes many days to process the donated blood. RISKS  AND COMPLICATIONS Although transfusion therapy is very safe and saves many lives, the main dangers of transfusion include:  Getting an infectious disease. Developing a transfusion reaction. This is an allergic reaction to something in the blood you were given. Every precaution is taken to prevent this. The decision to have a blood transfusion has been considered carefully by your caregiver before blood is given. Blood is not given unless the benefits outweigh the risks. AFTER THE TRANSFUSION Right after receiving a blood transfusion, you will usually feel much  better and more energetic. This is especially true if your red blood cells have gotten low (anemic). The transfusion raises the level of the red blood cells which carry oxygen, and this usually causes an energy increase. The nurse administering the transfusion will monitor you carefully for complications. HOME CARE INSTRUCTIONS  No special instructions are needed after a transfusion. You may find your energy is better. Speak with your caregiver about any limitations on activity for underlying diseases you may have. SEEK MEDICAL CARE IF:  Your condition is not improving after your transfusion. You develop redness or irritation at the intravenous (IV) site. SEEK IMMEDIATE MEDICAL CARE IF:  Any of the following symptoms occur over the next 12 hours: Shaking chills. You have a temperature by mouth above 102 F (38.9 C), not controlled by medicine. Chest, back, or muscle pain. People around you feel you are not acting correctly or are confused. Shortness of breath or difficulty breathing. Dizziness and fainting. You get a rash or develop hives. You have a decrease in urine output. Your urine turns a dark color or changes to pink, red, or brown. Any of the following symptoms occur over the next 10 days: You have a temperature by mouth above 102 F (38.9 C), not controlled by medicine. Shortness of breath. Weakness after normal  activity. The white part of the eye turns yellow (jaundice). You have a decrease in the amount of urine or are urinating less often. Your urine turns a dark color or changes to pink, red, or brown. Document Released: 02/08/2000 Document Revised: 05/05/2011 Document Reviewed: 09/27/2007 [facility] Patient Information 2014 [place], [place].  _______________________________________________________________________

## 2023-09-18 ENCOUNTER — Encounter (HOSPITAL_COMMUNITY)
Admission: RE | Admit: 2023-09-18 | Discharge: 2023-09-18 | Disposition: A | Discharge status: Home / Self Care | Source: Ambulatory Visit | Attending: General Surgery | Admitting: General Surgery

## 2023-09-18 ENCOUNTER — Other Ambulatory Visit: Payer: Self-pay

## 2023-09-18 ENCOUNTER — Encounter (HOSPITAL_COMMUNITY): Payer: Self-pay

## 2023-09-18 VITALS — BP 138/98 | HR 66 | Temp 98.4°F | Ht 72.0 in

## 2023-09-18 DIAGNOSIS — I2510 Atherosclerotic heart disease of native coronary artery without angina pectoris: Secondary | ICD-10-CM | POA: Insufficient documentation

## 2023-09-18 DIAGNOSIS — Z86718 Personal history of other venous thrombosis and embolism: Secondary | ICD-10-CM | POA: Insufficient documentation

## 2023-09-18 DIAGNOSIS — R9431 Abnormal electrocardiogram [ECG] [EKG]: Secondary | ICD-10-CM | POA: Diagnosis not present

## 2023-09-18 DIAGNOSIS — I10 Essential (primary) hypertension: Secondary | ICD-10-CM | POA: Insufficient documentation

## 2023-09-18 DIAGNOSIS — K5732 Diverticulitis of large intestine without perforation or abscess without bleeding: Secondary | ICD-10-CM | POA: Insufficient documentation

## 2023-09-18 DIAGNOSIS — Z01818 Encounter for other preprocedural examination: Secondary | ICD-10-CM | POA: Diagnosis not present

## 2023-09-18 DIAGNOSIS — R7303 Prediabetes: Secondary | ICD-10-CM | POA: Insufficient documentation

## 2023-09-18 HISTORY — DX: Palpitations: R00.2

## 2023-09-18 NOTE — Progress Notes (Signed)
 For Anesthesia: PCP - Katrinka Garnette KIDD, MD. ARNETTA: 09/01/23 Cardiologist - Lavona Agent, MD LOV: 2022)  Bowel Prep reminder: Reviewed.  Chest x-ray - CT Cardiac: 10/19/20 EKG - 09/18/23 Stress Test -  ECHO - 10/19/20 Cardiac Cath -  Pacemaker/ICD device last checked: Pacemaker orders received: Device Rep notified:  Spinal Cord Stimulator:N/A  Sleep Study - N/A CPAP -   Fasting Blood Sugar - N/A Checks Blood Sugar _____ times a day Date and result of last Hgb A1c- 6.3: 07/07/23  Last dose of GLP1 agonist- N/A GLP1 instructions:   Last dose of SGLT-2 inhibitors- N/A SGLT-2 instructions:   Blood Thinner Instructions:N/A Aspirin Instructions: Last Dose:  Activity level: Can go up a flight of stairs and activities of daily living without stopping and without chest pain and/or shortness of breath   Able to exercise without chest pain and/or shortness of breath  Anesthesia review: Hx: HTN,Murmur,DVT,NSVT,palpitations.  Patient denies shortness of breath, fever, cough and chest pain at PAT appointment   Patient verbalized understanding of instructions that were reviewed over the telephone.

## 2023-09-21 ENCOUNTER — Encounter (HOSPITAL_COMMUNITY): Payer: Self-pay

## 2023-09-21 NOTE — Anesthesia Preprocedure Evaluation (Signed)
 Anesthesia Evaluation  Patient identified by MRN, date of birth, ID band Patient awake    Reviewed: Allergy & Precautions, NPO status , Patient's Chart, lab work & pertinent test results  Airway Mallampati: II  TM Distance: >3 FB Neck ROM: Full    Dental  (+) Teeth Intact, Dental Advisory Given   Pulmonary asthma    breath sounds clear to auscultation       Cardiovascular hypertension, + Valvular Problems/Murmurs  Rhythm:Regular Rate:Normal  Echo (2022):  1. Left ventricular ejection fraction, by estimation, is 60 to 65%. The  left ventricle has normal function. The left ventricle has no regional  wall motion abnormalities. Left ventricular diastolic parameters were  normal.   2. Right ventricular systolic function is normal. The right ventricular  size is normal.   3. The mitral valve is normal in structure. Mild mitral valve  regurgitation.   4. The aortic valve is tricuspid. Aortic valve regurgitation is not  visualized.     Neuro/Psych negative neurological ROS  negative psych ROS   GI/Hepatic Neg liver ROS,GERD  ,,  Endo/Other  negative endocrine ROS    Renal/GU negative Renal ROS     Musculoskeletal negative musculoskeletal ROS (+)    Abdominal   Peds  Hematology negative hematology ROS (+)   Anesthesia Other Findings   Reproductive/Obstetrics                              Anesthesia Physical Anesthesia Plan  ASA: 3  Anesthesia Plan: General   Post-op Pain Management: Tylenol  PO (pre-op)* and Toradol  IV (intra-op)*   Induction: Intravenous  PONV Risk Score and Plan: 3 and Ondansetron , Dexamethasone  and Midazolam   Airway Management Planned: Oral ETT  Additional Equipment: None  Intra-op Plan:   Post-operative Plan: Extubation in OR  Informed Consent: I have reviewed the patients History and Physical, chart, labs and discussed the procedure including the risks,  benefits and alternatives for the proposed anesthesia with the patient or authorized representative who has indicated his/her understanding and acceptance.     Dental advisory given  Plan Discussed with: CRNA  Anesthesia Plan Comments: (See PAT note from 7/25)         Anesthesia Quick Evaluation

## 2023-09-21 NOTE — Progress Notes (Signed)
 Case: [id number] Date/Time: 09/23/23 0815   Procedure: COLECTOMY, PARTIAL, ROBOT-ASSISTED, LAPAROSCOPIC   Anesthesia type: General   Diagnosis: Diverticulitis of colon (without mention of hemorrhage)(562.11) [K57.32]   Pre-op diagnosis: recurrent diverticulitis   Location: [facility] ROOM 02 / [facility]   Surgeons: Debby Hila, MD       DISCUSSION: Mark Alvarado is a 48 year old male who presents to PAT prior to surgery above.  Past medical history significant for hypertension, CAD (by CT), palpitations, SVT, history of DVT, diverticulitis, prediabetes.  Patient evaluated by cardiology in 2022 for palpitations.  Echo showed normal LVEF with mild mitral regurgitation.  Coronary CTA was obtained which showed mild nonobstructive CAD.  Event monitor showed SVT.  Patient cut out caffeine and symptoms improved.  Seen by PCP on 09/01/2023.  Per PCP: able to run on treadmill for up to 30 minutes without chest pain or shortness of breath - able to easily complete 4 METS of activity without chest pain or shortness of breath  -has been careful with diet  Se[facility] 09/02/23 for sore throat and post nasal drip. Prescribed steroids, flonase. Pt denies cough, SOB at PAT visit.  VS: BP (!) 138/98   Pulse 66   Temp 36.9 C (Oral)   Ht 6' (1.829 m)   SpO2 99%   BMI 32.69 kg/m   PROVIDERS: Katrinka Garnette KIDD, MD   LABS: Labs reviewed: Acceptable for surgery. (all labs ordered are listed, but only abnormal results are displayed)  Labs Reviewed  TYPE AND SCREEN     IMAGES:   EKG 09/18/23:  Normal sinus rhythm T wave abnormality, consider lateral ischemia Abnormal ECG When compared with ECG of 03-Jun-2021 23:28, No significant change was found   CV:  Coronary CTA 01/09/2021:  IMPRESSION: 1. Mild CAD in proximal RCA, CADRADS = 2.   2. Coronary calcium  score is 5, which places the patient in the 82nd percentile for age and sex matched control.   3. Normal coronary origins with right  dominance.  Event monitor 11/06/2020:  Predominant rhythm was normal sinus Runs of NSVT The longest was 10 beats.   There was ventricular bigeminy  Echo 10/19/2020:  IMPRESSIONS    1. Left ventricular ejection fraction, by estimation, is 60 to 65%. The left ventricle has normal function. The left ventricle has no regional wall motion abnormalities. Left ventricular diastolic parameters were normal.  2. Right ventricular systolic function is normal. The right ventricular size is normal.  3. The mitral valve is normal in structure. Mild mitral valve regurgitation.  4. The aortic valve is tricuspid. Aortic valve regurgitation is not visualized.   Past Medical History:  Diagnosis Date   Allergy    Asthma    As a child   Chronic constipation    Diverticulitis    Diverticulosis    DVT (deep venous thrombosis) (HCC)    Hyperlipidemia    Hypertension    Lactose intolerance    Palpitations     Past Surgical History:  Procedure Laterality Date   BUNIONECTOMY Right 2002   COLONOSCOPY     ESOPHAGOGASTRODUODENOSCOPY ENDOSCOPY     left knee arthroscopic Left    1994- football injury    ROTATOR CUFF REPAIR Right    march 2019   ROTATOR CUFF REPAIR Left    Partial and [facility] joint repair december 2020    MEDICATIONS: No current outpatient medications on file.   No current facility-administered medications for this encounter.   Burnard CHRISTELLA Senna, PA-C [facility]  Surgical Short Stay/Anesthesiology [facility] Phone [phone] 09/21/2023 10:23 AM

## 2023-09-23 ENCOUNTER — Encounter (HOSPITAL_COMMUNITY): Payer: Self-pay | Admitting: General Surgery

## 2023-09-23 ENCOUNTER — Other Ambulatory Visit: Payer: Self-pay

## 2023-09-23 ENCOUNTER — Inpatient Hospital Stay (HOSPITAL_COMMUNITY): Payer: Self-pay | Admitting: Medical

## 2023-09-23 ENCOUNTER — Inpatient Hospital Stay (HOSPITAL_COMMUNITY)
Admission: RE | Admit: 2023-09-23 | Discharge: 2023-09-25 | DRG: 331 | Disposition: A | Discharge status: Home / Self Care | Attending: General Surgery | Admitting: General Surgery

## 2023-09-23 ENCOUNTER — Inpatient Hospital Stay (HOSPITAL_COMMUNITY): Admitting: Certified Registered Nurse Anesthetist

## 2023-09-23 ENCOUNTER — Encounter (HOSPITAL_COMMUNITY)
Admission: RE | Disposition: A | Discharge status: Home / Self Care | Payer: Self-pay | Source: Home / Self Care | Attending: General Surgery

## 2023-09-23 DIAGNOSIS — Z800 Family history of malignant neoplasm of digestive organs: Secondary | ICD-10-CM

## 2023-09-23 DIAGNOSIS — Z8249 Family history of ischemic heart disease and other diseases of the circulatory system: Secondary | ICD-10-CM | POA: Diagnosis not present

## 2023-09-23 DIAGNOSIS — K219 Gastro-esophageal reflux disease without esophagitis: Secondary | ICD-10-CM | POA: Diagnosis present

## 2023-09-23 DIAGNOSIS — K5730 Diverticulosis of large intestine without perforation or abscess without bleeding: Secondary | ICD-10-CM | POA: Diagnosis not present

## 2023-09-23 DIAGNOSIS — I10 Essential (primary) hypertension: Secondary | ICD-10-CM | POA: Diagnosis present

## 2023-09-23 DIAGNOSIS — Z91030 Bee allergy status: Secondary | ICD-10-CM | POA: Diagnosis not present

## 2023-09-23 DIAGNOSIS — E119 Type 2 diabetes mellitus without complications: Secondary | ICD-10-CM | POA: Diagnosis not present

## 2023-09-23 DIAGNOSIS — J45909 Unspecified asthma, uncomplicated: Secondary | ICD-10-CM | POA: Diagnosis not present

## 2023-09-23 DIAGNOSIS — Z86718 Personal history of other venous thrombosis and embolism: Secondary | ICD-10-CM

## 2023-09-23 DIAGNOSIS — Z885 Allergy status to narcotic agent status: Secondary | ICD-10-CM | POA: Diagnosis not present

## 2023-09-23 DIAGNOSIS — E785 Hyperlipidemia, unspecified: Secondary | ICD-10-CM | POA: Diagnosis present

## 2023-09-23 DIAGNOSIS — K5732 Diverticulitis of large intestine without perforation or abscess without bleeding: Secondary | ICD-10-CM | POA: Diagnosis not present

## 2023-09-23 DIAGNOSIS — Z888 Allergy status to other drugs, medicaments and biological substances status: Secondary | ICD-10-CM

## 2023-09-23 DIAGNOSIS — K5790 Diverticulosis of intestine, part unspecified, without perforation or abscess without bleeding: Principal | ICD-10-CM | POA: Diagnosis present

## 2023-09-23 LAB — TYPE AND SCREEN
ABO/RH(D): A POS
Antibody Screen: NEGATIVE

## 2023-09-23 LAB — ABO/RH: ABO/RH(D): A POS

## 2023-09-23 SURGERY — COLECTOMY, PARTIAL, ROBOT-ASSISTED, LAPAROSCOPIC
Anesthesia: General

## 2023-09-23 MED ORDER — KCL IN DEXTROSE-NACL 20-5-0.45 MEQ/L-%-% IV SOLN
INTRAVENOUS | Status: DC
  Filled 2023-09-23 (×3): qty 1000

## 2023-09-23 MED ORDER — SODIUM CHLORIDE 0.9 % IV SOLN
2.0000 g | Freq: Two times a day (BID) | INTRAVENOUS | Status: AC
  Administered 2023-09-23: 2 g via INTRAVENOUS
  Filled 2023-09-23: qty 2

## 2023-09-23 MED ORDER — ENOXAPARIN SODIUM 40 MG/0.4ML IJ SOSY
40.0000 mg | PREFILLED_SYRINGE | INTRAMUSCULAR | Status: DC
  Administered 2023-09-24: 40 mg via SUBCUTANEOUS
  Filled 2023-09-23: qty 0.4

## 2023-09-23 MED ORDER — ONDANSETRON HCL 4 MG/2ML IJ SOLN
4.0000 mg | Freq: Four times a day (QID) | INTRAMUSCULAR | Status: DC | PRN
  Administered 2023-09-23: 4 mg via INTRAVENOUS
  Filled 2023-09-23: qty 2

## 2023-09-23 MED ORDER — BUPIVACAINE-EPINEPHRINE 0.25% -1:200000 IJ SOLN
INTRAMUSCULAR | Status: DC | PRN
  Administered 2023-09-23: 60 mL

## 2023-09-23 MED ORDER — DIPHENHYDRAMINE HCL 50 MG/ML IJ SOLN: 12.5000 mg | Freq: Four times a day (QID) | INTRAMUSCULAR | Status: DC | PRN

## 2023-09-23 MED ORDER — POLYETHYLENE GLYCOL 3350 17 GM/SCOOP PO POWD: 238.0000 g | Freq: Once | ORAL | Status: DC

## 2023-09-23 MED ORDER — SIMETHICONE 80 MG PO CHEW
40.0000 mg | CHEWABLE_TABLET | Freq: Four times a day (QID) | ORAL | Status: DC | PRN
  Administered 2023-09-23 – 2023-09-24 (×2): 40 mg via ORAL
  Filled 2023-09-23 (×2): qty 1

## 2023-09-23 MED ORDER — MIDAZOLAM HCL 2 MG/2ML IJ SOLN
INTRAMUSCULAR | Status: AC
  Filled 2023-09-23: qty 2

## 2023-09-23 MED ORDER — SUGAMMADEX SODIUM 200 MG/2ML IV SOLN
INTRAVENOUS | Status: AC
  Filled 2023-09-23: qty 2

## 2023-09-23 MED ORDER — ACETAMINOPHEN 500 MG PO TABS
1000.0000 mg | ORAL_TABLET | ORAL | Status: AC
  Administered 2023-09-23: 1000 mg via ORAL
  Filled 2023-09-23: qty 2

## 2023-09-23 MED ORDER — OXYCODONE HCL 5 MG/5ML PO SOLN: 5.0000 mg | Freq: Once | ORAL | Status: DC | PRN

## 2023-09-23 MED ORDER — DEXAMETHASONE SODIUM PHOSPHATE 10 MG/ML IJ SOLN
INTRAMUSCULAR | Status: DC | PRN
  Administered 2023-09-23: 10 mg via INTRAVENOUS

## 2023-09-23 MED ORDER — ONDANSETRON HCL 4 MG PO TABS
4.0000 mg | ORAL_TABLET | Freq: Four times a day (QID) | ORAL | Status: DC | PRN
  Administered 2023-09-25: 4 mg via ORAL
  Filled 2023-09-23: qty 1

## 2023-09-23 MED ORDER — PHENYLEPHRINE 80 MCG/ML (10ML) SYRINGE FOR IV PUSH (FOR BLOOD PRESSURE SUPPORT)
PREFILLED_SYRINGE | INTRAVENOUS | Status: DC | PRN
  Administered 2023-09-23: 160 ug via INTRAVENOUS

## 2023-09-23 MED ORDER — GABAPENTIN 300 MG PO CAPS
300.0000 mg | ORAL_CAPSULE | Freq: Two times a day (BID) | ORAL | Status: DC
  Administered 2023-09-23 – 2023-09-24 (×3): 300 mg via ORAL
  Filled 2023-09-23 (×2): qty 3
  Filled 2023-09-23: qty 1

## 2023-09-23 MED ORDER — LIDOCAINE HCL (PF) 2 % IJ SOLN
INTRAMUSCULAR | Status: AC
  Filled 2023-09-23: qty 10

## 2023-09-23 MED ORDER — SODIUM CHLORIDE 0.9 % IV SOLN
2.0000 g | INTRAVENOUS | Status: AC
  Administered 2023-09-23: 2 g via INTRAVENOUS
  Filled 2023-09-23: qty 2

## 2023-09-23 MED ORDER — DEXAMETHASONE SODIUM PHOSPHATE 10 MG/ML IJ SOLN
INTRAMUSCULAR | Status: AC
  Filled 2023-09-23: qty 2

## 2023-09-23 MED ORDER — BUPIVACAINE LIPOSOME 1.3 % IJ SUSP: 20.0000 mL | Freq: Once | INTRAMUSCULAR | Status: DC

## 2023-09-23 MED ORDER — CHLORHEXIDINE GLUCONATE 0.12 % MT SOLN
15.0000 mL | Freq: Once | OROMUCOSAL | Status: AC
  Administered 2023-09-23: 15 mL via OROMUCOSAL

## 2023-09-23 MED ORDER — SACCHAROMYCES BOULARDII 250 MG PO CAPS
250.0000 mg | ORAL_CAPSULE | Freq: Two times a day (BID) | ORAL | Status: DC
  Administered 2023-09-23 – 2023-09-24 (×4): 250 mg via ORAL
  Filled 2023-09-23 (×4): qty 1

## 2023-09-23 MED ORDER — HYDROMORPHONE HCL 1 MG/ML IJ SOLN: 0.5000 mg | INTRAMUSCULAR | Status: DC | PRN

## 2023-09-23 MED ORDER — DROPERIDOL 2.5 MG/ML IJ SOLN: 0.6250 mg | Freq: Once | INTRAMUSCULAR | Status: DC | PRN

## 2023-09-23 MED ORDER — ORAL CARE MOUTH RINSE: 15.0000 mL | Freq: Once | OROMUCOSAL | Status: AC

## 2023-09-23 MED ORDER — ALUM & MAG HYDROXIDE-SIMETH 200-200-20 MG/5ML PO SUSP: 30.0000 mL | Freq: Four times a day (QID) | ORAL | Status: DC | PRN

## 2023-09-23 MED ORDER — DIPHENHYDRAMINE HCL 12.5 MG/5ML PO ELIX: 12.5000 mg | ORAL_SOLUTION | Freq: Four times a day (QID) | ORAL | Status: DC | PRN

## 2023-09-23 MED ORDER — ENSURE PRE-SURGERY PO LIQD: 592.0000 mL | Freq: Once | ORAL | Status: DC

## 2023-09-23 MED ORDER — ACETAMINOPHEN 10 MG/ML IV SOLN
1000.0000 mg | Freq: Once | INTRAVENOUS | Status: DC | PRN
Start: 2023-09-23 — End: 2023-09-23

## 2023-09-23 MED ORDER — GLYCOPYRROLATE 0.2 MG/ML IJ SOLN
INTRAMUSCULAR | Status: AC
  Filled 2023-09-23: qty 1

## 2023-09-23 MED ORDER — PHENYLEPHRINE 80 MCG/ML (10ML) SYRINGE FOR IV PUSH (FOR BLOOD PRESSURE SUPPORT)
PREFILLED_SYRINGE | INTRAVENOUS | Status: AC
  Filled 2023-09-23: qty 10

## 2023-09-23 MED ORDER — ONDANSETRON HCL 4 MG/2ML IJ SOLN
INTRAMUSCULAR | Status: AC
  Filled 2023-09-23: qty 4

## 2023-09-23 MED ORDER — PROPOFOL 10 MG/ML IV BOLUS
INTRAVENOUS | Status: DC | PRN
  Administered 2023-09-23: 200 mg via INTRAVENOUS

## 2023-09-23 MED ORDER — BISACODYL 5 MG PO TBEC: 20.0000 mg | DELAYED_RELEASE_TABLET | Freq: Once | ORAL | Status: DC

## 2023-09-23 MED ORDER — HEPARIN SODIUM (PORCINE) 5000 UNIT/ML IJ SOLN
5000.0000 [IU] | Freq: Once | INTRAMUSCULAR | Status: AC
  Administered 2023-09-23: 5000 [IU] via SUBCUTANEOUS
  Filled 2023-09-23: qty 1

## 2023-09-23 MED ORDER — PROPOFOL 10 MG/ML IV BOLUS
INTRAVENOUS | Status: AC
  Filled 2023-09-23: qty 20

## 2023-09-23 MED ORDER — GLYCOPYRROLATE 0.2 MG/ML IJ SOLN
INTRAMUSCULAR | Status: DC | PRN
  Administered 2023-09-23: .2 mg via INTRAVENOUS

## 2023-09-23 MED ORDER — LACTATED RINGERS IV SOLN: INTRAVENOUS | Status: DC

## 2023-09-23 MED ORDER — ENSURE SURGERY PO LIQD
237.0000 mL | Freq: Two times a day (BID) | ORAL | Status: DC
  Administered 2023-09-23 – 2023-09-24 (×3): 237 mL via ORAL

## 2023-09-23 MED ORDER — EPHEDRINE 5 MG/ML INJ
INTRAVENOUS | Status: AC
  Filled 2023-09-23: qty 5

## 2023-09-23 MED ORDER — FENTANYL CITRATE (PF) 100 MCG/2ML IJ SOLN
INTRAMUSCULAR | Status: AC
  Filled 2023-09-23: qty 2

## 2023-09-23 MED ORDER — EPHEDRINE SULFATE-NACL 50-0.9 MG/10ML-% IV SOSY
PREFILLED_SYRINGE | INTRAVENOUS | Status: DC | PRN
  Administered 2023-09-23 (×3): 10 mg via INTRAVENOUS

## 2023-09-23 MED ORDER — ALVIMOPAN 12 MG PO CAPS
12.0000 mg | ORAL_CAPSULE | ORAL | Status: AC
  Administered 2023-09-23: 12 mg via ORAL
  Filled 2023-09-23: qty 1

## 2023-09-23 MED ORDER — BUPIVACAINE-EPINEPHRINE (PF) 0.25% -1:200000 IJ SOLN
INTRAMUSCULAR | Status: AC
Start: 2023-09-23 — End: 2023-09-23
  Filled 2023-09-23: qty 60

## 2023-09-23 MED ORDER — SUGAMMADEX SODIUM 200 MG/2ML IV SOLN
INTRAVENOUS | Status: DC | PRN
  Administered 2023-09-23: 200 mg via INTRAVENOUS

## 2023-09-23 MED ORDER — GABAPENTIN 300 MG PO CAPS
300.0000 mg | ORAL_CAPSULE | ORAL | Status: AC
  Administered 2023-09-23: 300 mg via ORAL
  Filled 2023-09-23: qty 1

## 2023-09-23 MED ORDER — 0.9 % SODIUM CHLORIDE (POUR BTL) OPTIME
TOPICAL | Status: DC | PRN
  Administered 2023-09-23: 1000 mL

## 2023-09-23 MED ORDER — ONDANSETRON HCL 4 MG/2ML IJ SOLN
INTRAMUSCULAR | Status: DC | PRN
  Administered 2023-09-23: 4 mg via INTRAVENOUS

## 2023-09-23 MED ORDER — LIDOCAINE 2% (20 MG/ML) 5 ML SYRINGE
INTRAMUSCULAR | Status: DC | PRN
  Administered 2023-09-23: 60 mg via INTRAVENOUS

## 2023-09-23 MED ORDER — ACETAMINOPHEN 500 MG PO TABS
1000.0000 mg | ORAL_TABLET | Freq: Four times a day (QID) | ORAL | Status: DC
  Administered 2023-09-23 – 2023-09-25 (×6): 1000 mg via ORAL
  Filled 2023-09-23 (×7): qty 2

## 2023-09-23 MED ORDER — ROCURONIUM BROMIDE 10 MG/ML (PF) SYRINGE
PREFILLED_SYRINGE | INTRAVENOUS | Status: DC | PRN
Start: 2023-09-23 — End: 2023-09-23
  Administered 2023-09-23: 50 mg via INTRAVENOUS
  Administered 2023-09-23: 10 mg via INTRAVENOUS

## 2023-09-23 MED ORDER — MIDAZOLAM HCL 5 MG/5ML IJ SOLN
INTRAMUSCULAR | Status: DC | PRN
  Administered 2023-09-23: 2 mg via INTRAVENOUS

## 2023-09-23 MED ORDER — HYDROMORPHONE HCL 1 MG/ML IJ SOLN: 0.2500 mg | INTRAMUSCULAR | Status: DC | PRN

## 2023-09-23 MED ORDER — ALVIMOPAN 12 MG PO CAPS
12.0000 mg | ORAL_CAPSULE | Freq: Two times a day (BID) | ORAL | Status: DC
  Filled 2023-09-23: qty 1

## 2023-09-23 MED ORDER — TRAMADOL HCL 50 MG PO TABS
50.0000 mg | ORAL_TABLET | Freq: Four times a day (QID) | ORAL | Status: DC | PRN
  Administered 2023-09-23 – 2023-09-25 (×3): 100 mg via ORAL
  Filled 2023-09-23 (×3): qty 2

## 2023-09-23 MED ORDER — DEXMEDETOMIDINE HCL IN NACL 80 MCG/20ML IV SOLN
INTRAVENOUS | Status: AC
  Filled 2023-09-23: qty 20

## 2023-09-23 MED ORDER — FENTANYL CITRATE (PF) 100 MCG/2ML IJ SOLN
INTRAMUSCULAR | Status: DC | PRN
  Administered 2023-09-23: 100 ug via INTRAVENOUS

## 2023-09-23 MED ORDER — OXYCODONE HCL 5 MG PO TABS: 5.0000 mg | ORAL_TABLET | Freq: Once | ORAL | Status: DC | PRN

## 2023-09-23 MED ORDER — ROCURONIUM BROMIDE 10 MG/ML (PF) SYRINGE
PREFILLED_SYRINGE | INTRAVENOUS | Status: AC
  Filled 2023-09-23: qty 20

## 2023-09-23 MED ORDER — ENSURE PRE-SURGERY PO LIQD: 296.0000 mL | Freq: Once | ORAL | Status: DC

## 2023-09-23 MED ORDER — LACTATED RINGERS IR SOLN
Status: DC | PRN
Start: 2023-09-23 — End: 2023-09-23
  Administered 2023-09-23: 1000 mL

## 2023-09-23 SURGICAL SUPPLY — 68 items
BAG COUNTER SPONGE SURGICOUNT (BAG) ×1 IMPLANT
BLADE EXTENDED COATED 6.5IN (ELECTRODE) IMPLANT
CANNULA REDUCER 12-8 DVNC XI (CANNULA) IMPLANT
CELLS DAT CNTRL 66122 CELL SVR (MISCELLANEOUS) IMPLANT
COVER SURGICAL LIGHT HANDLE (MISCELLANEOUS) ×2 IMPLANT
COVER TIP SHEARS 8 DVNC (MISCELLANEOUS) ×1 IMPLANT
DEFOGGER SCOPE WARM SEASHARP (MISCELLANEOUS) ×1 IMPLANT
DERMABOND ADVANCED .7 DNX12 (GAUZE/BANDAGES/DRESSINGS) IMPLANT
DRAIN CHANNEL 19F RND (DRAIN) IMPLANT
DRAPE ARM DVNC X/XI (DISPOSABLE) ×4 IMPLANT
DRAPE COLUMN DVNC XI (DISPOSABLE) ×1 IMPLANT
DRAPE CV SPLIT W-CLR ANES SCRN (DRAPES) ×1 IMPLANT
DRAPE PERI GROIN 82X75IN TIB (DRAPES) ×1 IMPLANT
DRAPE SURG IRRIG POUCH 19X23 (DRAPES) ×1 IMPLANT
DRIVER NDL LRG 8 DVNC XI (INSTRUMENTS) ×1 IMPLANT
DRIVER NDLE LRG 8 DVNC XI (INSTRUMENTS) IMPLANT
DRSG OPSITE POSTOP 4X6 (GAUZE/BANDAGES/DRESSINGS) IMPLANT
DRSG OPSITE POSTOP 4X8 (GAUZE/BANDAGES/DRESSINGS) IMPLANT
ELECT PENCIL ROCKER SW 15FT (MISCELLANEOUS) ×1 IMPLANT
ELECT REM PT RETURN 15FT ADLT (MISCELLANEOUS) ×1 IMPLANT
EVACUATOR SILICONE 100CC (DRAIN) IMPLANT
FORCEPS BPLR FENES DVNC XI (FORCEP) IMPLANT
GLOVE BIO SURGEON STRL SZ 6.5 (GLOVE) ×3 IMPLANT
GLOVE INDICATOR 6.5 STRL GRN (GLOVE) ×3 IMPLANT
GOWN SRG XL LVL 4 BRTHBL STRL (GOWNS) ×1 IMPLANT
GOWN STRL REUS W/ TWL XL LVL3 (GOWN DISPOSABLE) ×3 IMPLANT
GRASPER SUT TROCAR 14GX15 (MISCELLANEOUS) IMPLANT
GRASPER TIP-UP FEN DVNC XI (INSTRUMENTS) ×1 IMPLANT
HOLDER FOLEY CATH W/STRAP (MISCELLANEOUS) ×1 IMPLANT
IRRIGATION SUCT STRKRFLW 2 WTP (MISCELLANEOUS) ×1 IMPLANT
KIT PROCEDURE DVNC SI (MISCELLANEOUS) IMPLANT
KIT TURNOVER KIT A (KITS) ×1 IMPLANT
NDL INSUFFLATION 14GA 120MM (NEEDLE) ×1 IMPLANT
NEEDLE INSUFFLATION 14GA 120MM (NEEDLE) ×1 IMPLANT
PACK COLON (CUSTOM PROCEDURE TRAY) ×1 IMPLANT
PAD POSITIONING PINK XL (MISCELLANEOUS) ×1 IMPLANT
RELOAD STAPLE 60 3.5 BLU DVNC (STAPLE) IMPLANT
RELOAD STAPLE 60 4.3 GRN DVNC (STAPLE) IMPLANT
RETRACTOR WND ALEXIS 18 MED (MISCELLANEOUS) IMPLANT
SCISSORS LAP 5X35 DISP (ENDOMECHANICALS) IMPLANT
SCISSORS MNPLR CVD DVNC XI (INSTRUMENTS) ×1 IMPLANT
SEAL UNIV 5-12 XI (MISCELLANEOUS) ×3 IMPLANT
SEALER VESSEL EXT DVNC XI (MISCELLANEOUS) ×1 IMPLANT
SOLUTION ELECTROSURG ANTI STCK (MISCELLANEOUS) ×1 IMPLANT
SPIKE FLUID TRANSFER (MISCELLANEOUS) IMPLANT
STAPLER 60 SUREFORM DVNC (STAPLE) IMPLANT
STAPLER ECHELON POWER CIR 29 (STAPLE) IMPLANT
STAPLER ECHELON POWER CIR 31 (STAPLE) IMPLANT
SUT ETHILON 2 0 PS N (SUTURE) IMPLANT
SUT NOVA NAB GS-21 1 T12 (SUTURE) ×2 IMPLANT
SUT PROLENE 2 0 KS (SUTURE) IMPLANT
SUT SILK 2 0 SH CR/8 (SUTURE) IMPLANT
SUT SILK 2-0 18XBRD TIE 12 (SUTURE) ×1 IMPLANT
SUT SILK 3 0 SH CR/8 (SUTURE) ×1 IMPLANT
SUT SILK 3-0 18XBRD TIE 12 (SUTURE) IMPLANT
SUT VIC AB 2-0 SH 18 (SUTURE) IMPLANT
SUT VIC AB 2-0 SH 27X BRD (SUTURE) IMPLANT
SUT VIC AB 3-0 SH 18 (SUTURE) IMPLANT
SUT VIC AB 4-0 PS2 27 (SUTURE) ×2 IMPLANT
SUT VICRYL 0 UR6 27IN ABS (SUTURE) ×1 IMPLANT
SUTURE V-LC BRB 180 2/0GR6GS22 (SUTURE) IMPLANT
SYR 20ML ECCENTRIC (SYRINGE) ×1 IMPLANT
SYSTEM WOUND ALEXIS 18CM MED (MISCELLANEOUS) IMPLANT
TOWEL OR 17X26 10 PK STRL BLUE (TOWEL DISPOSABLE) IMPLANT
TRAY FOLEY MTR SLVR 16FR STAT (SET/KITS/TRAYS/PACK) ×1 IMPLANT
TROCAR ADV FIXATION 5X100MM (TROCAR) ×1 IMPLANT
TUBING CONNECTING 10 (TUBING) ×2 IMPLANT
TUBING INSUFFLATION 10FT LAP (TUBING) ×1 IMPLANT

## 2023-09-23 NOTE — Progress Notes (Signed)
 Pt has c/o of right side flank pain medication was offered he declined. He also has c/o feeling the urge to urinate but does not know if he is I assured him he was and explained to him about the foley being inserted into his bladder once I explained he said ok. Wife is in the room with patient and will be staying the night.

## 2023-09-23 NOTE — H&P (Signed)
 REFERRING PHYSICIAN:  Alan Coombs., PA   PROVIDER:  BERNARDA WANDA NED, MD   MRN: [id number] DOB: 1975/07/26    Subjective      History of Present Illness:   .  Patient with multiple episodes of recurrent uncomplicated diverticulitis of the sigmoid colon over the past 2 years.   He was seen in the office prior to that by Dr. Lyndel and surgery was recommended.  Patient wanted to obtain another colonoscopy.  This was completed in November 2024 and showed severe diverticulosis throughout the colon.  Throughout the entire beginning of this year he has had multiple episodes of diverticulitis including the most recent episode that continued for about 8 weeks in April and May.  He is now feeling better and is here to discuss surgery in more detail.     Review of Systems: A complete review of systems was obtained from the patient.  I have reviewed this information and discussed as appropriate with the patient.  See HPI as well for other ROS.   Medical History: Past Medical History         Past Medical History:  Diagnosis Date   Diabetes mellitus without complication (CMS/HHS-HCC)     GERD (gastroesophageal reflux disease)     Hyperlipidemia     Hypertension     NSVT (nonsustained ventricular tachycardia) (CMS/HHS-HCC)          Problem List       Patient Active Problem List  Diagnosis   Essential hypertension   Heart murmur   History of DVT (deep vein thrombosis)        Past Surgical History           Past Surgical History:  Procedure Laterality Date   ARTHROSCOPIC ROTATOR CUFF REPAIR Left     ARTHROSCOPIC ROTATOR CUFF REPAIR Right     BUNION CORRECTION       COLONOSCOPY       knee arthroscopic Left     KNEE ARTHROSCOPY            Allergies  No Known Allergies      Medications Ordered Prior to Encounter             Current Outpatient Medications on File Prior to Visit  Medication Sig Dispense Refill   amoxicillin -clavulanate (AUGMENTIN ) 875-125  mg tablet Take 1 tablet by mouth 2 (two) times daily (Patient not taking: Reported on 07/29/2023)       dicyclomine  (BENTYL ) 20 mg tablet Take by mouth (Patient not taking: Reported on 07/29/2023)       HYDROcodone -acetaminophen  (NORCO) 5-325 mg tablet  (Patient not taking: Reported on 07/29/2023)       polyethylene glycol (MIRALAX ) powder Take 17 g by mouth once daily (Patient not taking: Reported on 07/29/2023)        No current facility-administered medications on file prior to visit.        Family History           Family History  Problem Relation Age of Onset   Hyperlipidemia (Elevated cholesterol) Mother     High blood pressure (Hypertension) Mother     Hyperlipidemia (Elevated cholesterol) Father     High blood pressure (Hypertension) Father     Colon cancer Sister     Colon cancer Maternal Aunt     Colon cancer Paternal Uncle     Colon cancer Maternal Grandmother     Coronary Artery Disease (Blocked arteries around heart) Paternal Grandfather  Tobacco Use History  Social History         Tobacco Use  Smoking Status Never  Smokeless Tobacco Not on file        Social History  Social History           Socioeconomic History   Marital status: Married  Tobacco Use   Smoking status: Never  Vaping Use   Vaping status: Never Used  Substance and Sexual Activity   Alcohol use: Yes   Drug use: Never    Social Drivers of Barista Strain: Low Risk  (02/23/2023)    Received from [facility]    Overall Financial Resource Strain (CARDIA)     Difficulty of Paying Living Expenses: Not hard at all  Food Insecurity: No Food Insecurity (02/23/2023)    Received from [facility]    Hunger Vital Sign     Worried About Running Out of Food in the Last Year: Never true     Ran Out of Food in the Last Year: Never true  Transportation Needs: No Transportation Needs (02/23/2023)    Received from [facility] - Transportation     Lack of  Transportation (Medical): No     Lack of Transportation (Non-Medical): No  Physical Activity: Unknown (02/23/2023)    Received from [facility]    Exercise Vital Sign     Days of Exercise per Week: 0 days  Stress: No Stress Concern Present (02/23/2023)    Received from [facility] - Occupational Stress Questionnaire     Feeling of Stress : Not at all  Social Connections: Socially Integrated (02/23/2023)    Received from [facility]    Social Connection and Isolation Panel [NHANES]     Frequency of Communication with Friends and Family: More than three times a week     Frequency of Social Gatherings with Friends and Family: Once a week     Attends Religious Services: More than 4 times per year     Active Member of [employer] or Organizations: Yes     Attends Banker Meetings: 1 to 4 times per year     Marital Status: Married  Housing Stability: Unknown (07/29/2023)    Housing Stability Vital Sign     Homeless in the Last Year: No        Objective:      Vitals:   09/23/23 0650  BP: (!) 138/90  Pulse: 78  Resp: 17  Temp: 97.9 F (36.6 C)  SpO2: 98%      Exam Gen: NAD CV: RRR Pulm: CTA Abd: soft     Labs, Imaging and Diagnostic Testing: CT and colonoscopy report and images reviewed   Assessment and Plan:  Diagnoses and all orders for this visit:   Diverticulitis of large intestine without perforation or abscess without bleeding    48 year old male with recurrent versus smoldering diverticulitis.  We discussed that surgical resection is really the only way to cure this.  We discussed proceeding with surgery in late July or early August.  All questions were answered.  They agree to proceed. The surgery and anatomy were described to the patient as well as the risks of surgery and the possible complications.  These include: Bleeding, deep abdominal infections and possible wound complications such as hernia and  infection, damage to adjacent structures, leak of surgical connections, which  can lead to other surgeries and possibly an ostomy, possible need for other procedures, such as abscess drains in radiology, possible prolonged hospital stay, possible diarrhea from removal of part of the colon, possible constipation from narcotics, possible bowel, bladder or sexual dysfunction if having rectal surgery, prolonged fatigue/weakness or appetite loss, possible early recurrence of of disease, possible complications of their medical problems such as heart disease or arrhythmias or lung problems, death (less than 1%). I believe the patient understands and wishes to proceed with the surgery.    Bernarda JAYSON Ned, MD Colon and Rectal Surgery [facility]

## 2023-09-23 NOTE — Progress Notes (Signed)
 Pt is having c/o foley cathter he say it hurts and he would like it out will contact doctor

## 2023-09-23 NOTE — Anesthesia Procedure Notes (Signed)
 Procedure Name: Intubation Date/Time: 09/23/2023 8:36 AM  Performed by: Mitchell Celestine BRAVO, CRNAPre-anesthesia Checklist: Patient identified, Patient being monitored, Timeout performed, Emergency Drugs available and Suction available Patient Re-evaluated:Patient Re-evaluated prior to induction Oxygen Delivery Method: Circle system utilized Preoxygenation: Pre-oxygenation with 100% oxygen Induction Type: IV induction Ventilation: Mask ventilation without difficulty Laryngoscope Size: Mac and 3 Grade View: Grade I Tube type: Oral Tube size: 7.5 mm Number of attempts: 1 Airway Equipment and Method: Stylet Placement Confirmation: ETT inserted through vocal cords under direct vision, positive ETCO2 and breath sounds checked- equal and bilateral Secured at: 23 cm Tube secured with: Tape Dental Injury: Teeth and Oropharynx as per pre-operative assessment

## 2023-09-23 NOTE — Anesthesia Postprocedure Evaluation (Signed)
 Anesthesia Post Note  Patient: Mark Alvarado  Procedure(s) Performed: ROBOTIC SIGMOIDECTOMY     Patient location during evaluation: PACU Anesthesia Type: General Level of consciousness: awake and alert Pain management: pain level controlled Vital Signs Assessment: post-procedure vital signs reviewed and stable Respiratory status: spontaneous breathing, nonlabored ventilation and respiratory function stable Cardiovascular status: blood pressure returned to baseline Postop Assessment: no apparent nausea or vomiting Anesthetic complications: no   No notable events documented.  Last Vitals:  Vitals:   09/23/23 1200 09/23/23 1233  BP: 123/72 123/72  Pulse: 79 90  Resp: 17 16  Temp: 36.6 C (!) 36.3 C  SpO2: 93% 99%    Last Pain:  Vitals:   09/23/23 1233  TempSrc: Oral  PainSc:                  [name]

## 2023-09-23 NOTE — Discharge Instructions (Signed)

## 2023-09-23 NOTE — Transfer of Care (Signed)
 Immediate Anesthesia Transfer of Care Note  Patient: Mark Alvarado  Procedure(s) Performed: Procedure(s): ROBOTIC SIGMOIDECTOMY (N/A)  Patient Location: PACU  Anesthesia Type:General  Level of Consciousness: Alert, Awake, Oriented  Airway & Oxygen Therapy: Patient Spontanous Breathing  Post-op Assessment: Report given to RN  Post vital signs: Reviewed and stable  Last Vitals:  Vitals:   09/23/23 0650  BP: (!) 138/90  Pulse: 78  Resp: 17  Temp: 36.6 C  SpO2: 98%    Complications: No apparent anesthesia complications

## 2023-09-23 NOTE — Op Note (Signed)
 09/23/2023  11:03 AM  PATIENT:  Mark Alvarado  48 y.o. male  Patient Care Team: Katrinka Garnette KIDD, MD as PCP - General (Family Medicine) Lavona Agent, MD as Consulting Physician (Cardiology)  PRE-OPERATIVE DIAGNOSIS:  Smoldering diverticulitis  POST-OPERATIVE DIAGNOSIS:  Smoldering diverticulitis  PROCEDURE:  ROBOTIC SIGMOIDECTOMY    Surgeon(s): Debby Hila, MD Teresa Lonni HERO, MD  ASSISTANT: Dr Teresa   ANESTHESIA:   local and general  EBL: 50ml Total I/O In: 1100 [I.V.:1000; IV Piggyback:100] Out: 205 [Urine:200; Blood:5]  Delay start of Pharmacological VTE agent (>24hrs) due to surgical blood loss or risk of bleeding:  no  DRAINS: none   SPECIMEN:  Source of Specimen:  Rectosigmoid colon  DISPOSITION OF SPECIMEN:  PATHOLOGY  COUNTS:  YES  PLAN OF CARE: Admit to inpatient   PATIENT DISPOSITION:  PACU - hemodynamically stable.  INDICATION:    Patient with recurrent/smoldering diverticulitis.  I recommended segmental resection:  The anatomy & physiology of the digestive tract was discussed.  The pathophysiology was discussed.  Natural history risks without surgery was discussed.   I worked to give an overview of the disease and the frequent need to have multispecialty involvement.  I feel the risks of no intervention will lead to serious problems that outweigh the operative risks; therefore, I recommended a partial colectomy to remove the pathology.  Laparoscopic & open techniques were discussed.   Risks such as bleeding, infection, abscess, leak, reoperation, possible ostomy, hernia, heart attack, death, and other risks were discussed.  I noted a good likelihood this will help address the problem.   Goals of post-operative recovery were discussed as well.    The patient expressed understanding & wished to proceed with surgery.  OR FINDINGS:   Patient had chronically inflamed proximal sigmoid colon  The anastomosis rests ~18 cm from the anal verge by  rigid proctoscopy.  DESCRIPTION:   Informed consent was confirmed.  The patient underwent general anaesthesia without difficulty.  The patient was positioned appropriately.  VTE prevention in place.  The patient's abdomen was clipped, prepped, & draped in a sterile fashion.  Surgical timeout confirmed our plan.  The patient was positioned in reverse Trendelenburg.  Abdominal entry was gained using a Veress needle in the left upper quadrant.  Entry was clean.  I induced carbon dioxide insufflation.  An 8mm robotic port was placed in the right upper quadrant.  Camera inspection revealed no injury.  Extra ports were carefully placed under direct laparoscopic visualization.  I laparoscopically reflected the greater omentum and the upper abdomen the small bowel in the upper abdomen. The patient was appropriately positioned and the robot was docked to the patient's left side.  Instruments were placed under direct visualization.   Patient's sigmoid colon was mildly adherent to the left pelvic sidewall and peritoneum overlying the bladder.  I began by scoring the base of peritoneum of the right side of the mesentery of the left colon from the ligament of Treitz to the peritoneal reflection of the mid rectum. I elevated the sigmoid mesentery and enetered into the retro-mesenteric plane. We were able to identify the left ureter and gonadal vessels. We kept those posterior within the retroperitoneum and elevated the left colon mesentery off that. I did isolated IMA pedicle but did not ligate it yet.  I continued distally and got into the avascular plane posterior to the mesorectum. This allowed me to help mobilize the rectum as well by freeing the mesorectum off the sacrum.  I mobilized  the peritoneal coverings towards the peritoneal reflection on both the right and left sides of the rectum.  I could see the right and left ureters and stayed away from them.    I skeletonized the inferior mesenteric artery pedicle.  I  went down to its takeoff from the aorta.  After confirming the left ureter was out of the way, I went ahead and ligated the inferior mesenteric artery pedicle with bipolar robotic vessel sealer ~2cm above its takeoff from the aorta.  We ensured hemostasis. I skeletonized the mesorectum at the junction at the proximal rectum using blunt dissection & bipolar robotic vessel sealer.  I mobilized the left colon in a lateral to medial fashion off the line of Toldt up towards the splenic flexure to ensure good mobilization of the left colon to reach into the pelvis.  I divided the remaining mesentery up to the level of the descending sigmoid junction using the robotic vessel sealer.  We then switched to the suprapubic port and placed a 60 mm green load stapler through the 12 mm port.  The colon was transected at the rectosigmoid junction using 1 stapler fire.  At this point the abdomen was inspected for hemostasis.  There was no active bleeding.  The robot was undocked and the abdomen was desufflated.  The 12 mm port was enlarged into a Pfannenstiel incision and Alexis wound protector was placed.  The colon was brought out through this and transected proximally over pursestring device.  A 2-0 Prolene pursestring was placed and this was secured with interrupted 3-0 silk sutures.  A 29 mm EEA anvil was placed into the colon and the pursestring was tied tightly around this.  This was placed back into the abdomen and the EEA stapler was introduced through the rectum.  An anastomosis was created in end and fashion using the EEA stapler under laparoscopic visualization.  There was no leak tested with insufflation under irrigation.  The staple line was visually inspected internally and showed no signs of active bleeding.  There was good signs of perfusion internally.  There was no tension noted on the anastomosis.  The remaining abdomen was irrigated.  The abdomen was then desufflated and the ports were removed.  We switched to  clean gowns, gloves, instruments and drapes.  The peritoneum of the Pfannenstiel incision was closed using a running 0 Vicryl suture.  The fascia was closed using 2, #1 Novafil running sutures.  The subcutaneous tissue was reapproximated using a running 2-0 Vicryl suture and the skin was closed using a running 4-0 Vicryl subcuticular suture and Dermabond.  The remaining port sites were also closed using interrupted 4-0 Vicryl sutures and Dermabond.  The patient was then awakened from anesthesia and sent to the postanesthesia care unit in stable condition.  All counts were correct per operating room staff.  An MD assistant was necessary for tissue manipulation, retraction and positioning due to the complexity of the case and hospital policies   Bernarda JAYSON Ned, MD  Colorectal and General Surgery [facility]

## 2023-09-24 LAB — CBC
HCT: 39.1 % (ref 39.0–52.0)
Hemoglobin: 12.3 g/dL — ABNORMAL LOW (ref 13.0–17.0)
MCH: 27.5 pg (ref 26.0–34.0)
MCHC: 31.5 g/dL (ref 30.0–36.0)
MCV: 87.5 fL (ref 80.0–100.0)
Platelets: 234 K/uL (ref 150–400)
RBC: 4.47 MIL/uL (ref 4.22–5.81)
RDW: 15.1 % (ref 11.5–15.5)
WBC: 12.8 K/uL — ABNORMAL HIGH (ref 4.0–10.5)
nRBC: 0 % (ref 0.0–0.2)

## 2023-09-24 LAB — BASIC METABOLIC PANEL WITH GFR
Anion gap: 6 (ref 5–15)
BUN: 12 mg/dL (ref 6–20)
CO2: 25 mmol/L (ref 22–32)
Calcium: 8.8 mg/dL — ABNORMAL LOW (ref 8.9–10.3)
Chloride: 106 mmol/L (ref 98–111)
Creatinine, Ser: 1.11 mg/dL (ref 0.61–1.24)
GFR, Estimated: >60 mL/min (ref >60)
Glucose, Bld: 108 mg/dL — ABNORMAL HIGH (ref 70–99)
Potassium: 4.5 mmol/L (ref 3.5–5.1)
Sodium: 137 mmol/L (ref 135–145)

## 2023-09-24 NOTE — Progress Notes (Signed)
   09/24/23 0926  TOC Brief Assessment  Insurance and Status Reviewed  Patient has primary care physician Yes  Home environment has been reviewed home with spouse  Prior level of function: independent  Prior/Current Home Services No current home services  Social Drivers of Health Review SDOH reviewed no interventions necessary  Readmission risk has been reviewed Yes  Transition of care needs no transition of care needs at this time

## 2023-09-24 NOTE — Plan of Care (Signed)

## 2023-09-24 NOTE — Progress Notes (Signed)
 1 Day Post-Op Robotic Sigmoidectomy Subjective: Having BM's, foley out.  Ambulating in hall, pain controlled, no nausea  Objective: Vital signs in last 24 hours: Temp:  [97.4 F (36.3 C)-98.5 F (36.9 C)] 97.4 F (36.3 C) (07/31 0904) Pulse Rate:  [78-103] 78 (07/31 0904) Resp:  [15-20] 16 (07/31 0904) BP: (118-138)/(70-95) 122/72 (07/31 0904) SpO2:  [93 %-100 %] 98 % (07/31 0904) Weight:  [107.2 kg] 107.2 kg (07/31 0711)   Intake/Output from previous day: 07/30 0701 - 07/31 0700 In: 3232.4 [P.O.:960; I.V.:2172.4; IV Piggyback:100] Out: 3630 [Urine:3625; Blood:5] Intake/Output this shift: Total I/O In: 120 [P.O.:120] Out: -    General appearance: alert and cooperative GI: soft, non-distended  Incision: no significant drainage, no significant erythema  Lab Results:  Recent Labs    09/24/23 0513  WBC 12.8*  HGB 12.3*  HCT 39.1  PLT 234   BMET Recent Labs    09/24/23 0513  NA 137  K 4.5  CL 106  CO2 25  GLUCOSE 108*  BUN 12  CREATININE 1.11  CALCIUM  8.8*   PT/INR No results for input(s): LABPROT, INR in the last 72 hours. ABG No results for input(s): PHART, HCO3 in the last 72 hours.  Invalid input(s): PCO2, PO2  MEDS, Scheduled  acetaminophen   1,000 mg Oral Q6H   alvimopan   12 mg Oral BID   enoxaparin  (LOVENOX ) injection  40 mg Subcutaneous Q24H   feeding supplement  237 mL Oral BID BM   gabapentin   300 mg Oral BID   saccharomyces boulardii  250 mg Oral BID    Studies/Results: No results found.  Assessment: s/p Procedure(s): ROBOTIC SIGMOIDECTOMY Patient Active Problem List   Diagnosis Date Noted   Diverticular disease 09/23/2023   NSVT (nonsustained ventricular tachycardia) (HCC) 02/04/2021   Nonspecific abnormal electrocardiogram (ECG) (EKG) 02/04/2021   Diverticulosis of colon without hemorrhage 03/29/2020   Family hx of colon cancer 03/29/2020   Allergic rhinitis 12/28/2019   Childhood asthma 12/28/2019   GERD  (gastroesophageal reflux disease) 12/28/2019   Heart murmur 12/28/2019   Hyperlipidemia 12/28/2019   Essential hypertension 12/28/2019   History of DVT (deep vein thrombosis) 07/23/2017    Expected post op course  Plan: Advance diet as tolerated Ambulate in hall Possible d/c tomorrow    LOS: 1 day     .Bernarda JAYSON Ned, MD [facility], [place]    09/24/2023 10:12 AM

## 2023-09-25 ENCOUNTER — Other Ambulatory Visit (HOSPITAL_COMMUNITY): Payer: Self-pay

## 2023-09-25 LAB — BASIC METABOLIC PANEL WITH GFR
Anion gap: 8 (ref 5–15)
BUN: 18 mg/dL (ref 6–20)
CO2: 26 mmol/L (ref 22–32)
Calcium: 8.4 mg/dL — ABNORMAL LOW (ref 8.9–10.3)
Chloride: 105 mmol/L (ref 98–111)
Creatinine, Ser: 0.86 mg/dL (ref 0.61–1.24)
GFR, Estimated: >60 mL/min (ref >60)
Glucose, Bld: 92 mg/dL (ref 70–99)
Potassium: 3.9 mmol/L (ref 3.5–5.1)
Sodium: 139 mmol/L (ref 135–145)

## 2023-09-25 LAB — CBC
HCT: 39.2 % (ref 39.0–52.0)
Hemoglobin: 11.8 g/dL — ABNORMAL LOW (ref 13.0–17.0)
MCH: 26.6 pg (ref 26.0–34.0)
MCHC: 30.1 g/dL (ref 30.0–36.0)
MCV: 88.5 fL (ref 80.0–100.0)
Platelets: 214 K/uL (ref 150–400)
RBC: 4.43 MIL/uL (ref 4.22–5.81)
RDW: 14.9 % (ref 11.5–15.5)
WBC: 11.7 K/uL — ABNORMAL HIGH (ref 4.0–10.5)
nRBC: 0 % (ref 0.0–0.2)

## 2023-09-25 LAB — SURGICAL PATHOLOGY

## 2023-09-25 MED ORDER — TRAMADOL HCL 50 MG PO TABS
50.0000 mg | ORAL_TABLET | Freq: Four times a day (QID) | ORAL | 0 refills | Status: AC | PRN
  Filled 2023-09-25: qty 30, 4d supply, fill #0

## 2023-09-25 NOTE — Discharge Summary (Signed)
 Physician Discharge Summary  Patient ID: Mark Alvarado MRN: [id number] DOB/AGE: [birth year] 48 y.o.  Admit date: 09/23/2023 Discharge date: 09/25/2023  Admission Diagnoses: Diverticular disease Discharge Diagnoses:  Principal Problem:   Diverticular disease   Discharged Condition: good  Hospital Course: Patient was admitted to the med surg floor after surgery.  Diet was advanced as tolerated.  Patient began to have bowel function on postop day 1.  By postop day 2, he was tolerating a solid diet and pain was controlled with oral medications.  He was urinating without difficulty and ambulating without assistance.  Patient was felt to be in stable condition for discharge to home.   Consults: None  Significant Diagnostic Studies: labs: cbc, bmet  Treatments: IV hydration, analgesia: acetaminophen , and surgery: robotic sigmoidectomy  Discharge Exam: Blood pressure 138/89, pulse 67, temperature 98.6 F (37 C), temperature source Oral, resp. rate 18, height 6' (1.829 m), weight 107.2 kg, SpO2 99%. General appearance: alert and cooperative GI: soft, non-distended Incision/Wound: clean, dry, intact  Disposition: Discharge disposition: 01-Home or Self Care        Allergies as of 09/25/2023       Reactions   Bee Pollen    Other Reaction(s): Cough   Dust Mite Extract    Grass Extracts [gramineae Pollens]    Hydrocodone -acetaminophen  Other (See Comments)   Lethargic, felt out of body   Pollen Extract         Medication List     TAKE these medications    traMADol  50 MG tablet Commonly known as: ULTRAM  Take 1-2 tablets (50-100 mg total) by mouth every 6 (six) hours as needed for moderate pain (pain score 4-6).        Follow-up Information     Mark Hila, MD. Schedule an appointment as soon as possible for a visit in 2 week(s).   Specialties: General Surgery, Colon and Rectal Surgery Contact information: [address] [place] [address] [place]  [address] [phone]                 Signed: Hila JAYSON Mark 09/25/2023, 8:12 AM

## 2023-09-25 NOTE — Progress Notes (Signed)
 Reviewed d/c paperwork with pt and wife. All questions answered. Pt requesting note for primary MD. Meds-to-bed delivering d/c meds.

## 2023-09-30 ENCOUNTER — Other Ambulatory Visit: Payer: Self-pay | Admitting: General Surgery

## 2023-09-30 ENCOUNTER — Other Ambulatory Visit (HOSPITAL_COMMUNITY): Payer: Self-pay | Admitting: General Surgery

## 2023-09-30 ENCOUNTER — Ambulatory Visit (HOSPITAL_COMMUNITY)
Admission: RE | Admit: 2023-09-30 | Discharge: 2023-09-30 | Disposition: A | Discharge status: Home / Self Care | Source: Ambulatory Visit | Attending: Vascular Surgery | Admitting: Vascular Surgery

## 2023-09-30 DIAGNOSIS — R52 Pain, unspecified: Secondary | ICD-10-CM | POA: Diagnosis not present

## 2023-09-30 DIAGNOSIS — I824Z2 Acute embolism and thrombosis of unspecified deep veins of left distal lower extremity: Secondary | ICD-10-CM

## 2023-09-30 MED ORDER — APIXABAN 5 MG PO TABS
ORAL_TABLET | ORAL | 0 refills | Status: DC
Start: 2023-09-30 — End: 2023-10-13

## 2023-09-30 MED ORDER — APIXABAN 5 MG PO TABS
ORAL_TABLET | ORAL | 0 refills | Status: DC
Start: 2023-09-30 — End: 2023-09-30

## 2023-09-30 MED ORDER — APIXABAN 5 MG PO TABS: ORAL_TABLET | ORAL | 0 refills | Status: DC

## 2023-10-13 ENCOUNTER — Encounter: Payer: Self-pay | Admitting: Hematology

## 2023-10-13 ENCOUNTER — Inpatient Hospital Stay

## 2023-10-13 ENCOUNTER — Inpatient Hospital Stay: Attending: Hematology | Admitting: Hematology

## 2023-10-13 VITALS — BP 123/95 | HR 74 | Temp 97.6°F | Resp 17 | Ht 72.0 in | Wt 239.4 lb

## 2023-10-13 DIAGNOSIS — I82461 Acute embolism and thrombosis of right calf muscular vein: Secondary | ICD-10-CM | POA: Diagnosis not present

## 2023-10-13 DIAGNOSIS — Z86718 Personal history of other venous thrombosis and embolism: Secondary | ICD-10-CM

## 2023-10-13 DIAGNOSIS — Z7901 Long term (current) use of anticoagulants: Secondary | ICD-10-CM | POA: Diagnosis not present

## 2023-10-13 DIAGNOSIS — Z800 Family history of malignant neoplasm of digestive organs: Secondary | ICD-10-CM | POA: Insufficient documentation

## 2023-10-13 MED ORDER — APIXABAN 5 MG PO TABS: 5.0000 mg | ORAL_TABLET | Freq: Two times a day (BID) | ORAL | 1 refills | Status: DC

## 2023-10-13 NOTE — Progress Notes (Signed)
 [facility]   [phone] [phone]   Clinic New Consult Note   Patient Care Team: Katrinka Garnette KIDD, MD as PCP - General (Family Medicine) Lavona Agent, MD as Consulting Physician (Cardiology) 10/13/2023  CHIEF COMPLAINTS/PURPOSE OF CONSULTATION:  Left lower extremity DVT after surgery  REFERRING PHYSICIAN: Dr. Debby  Discussed the use of AI scribe software for clinical note transcription with the patient, who gave verbal consent to proceed.  History of Present Illness Mark Alvarado is a 48 year old male who presents for a new consult for deep vein thrombosis (DVT). He was referred by Dr. Debby for evaluation of his DVT.  He underwent colon surgery on September 23, 2023, for diverticulitis. On September 27, 2023, he developed leg pain with tenderness in the bend of the leg and tightness in the calf. A Doppler ultrasound on September 30, 2023, confirmed small blood clots in the calf muscle. The pain resolved within a week without swelling.  He has a family history of colon cancer, with his sister having passed away from cancer and four family members having had colon cancer. He is a Firefighter, exercises regularly, occasionally consumes alcohol, and does not smoke. He has intermittent high blood pressure.     MEDICAL HISTORY:  Past Medical History:  Diagnosis Date   Allergy    Asthma    As a child   Chronic constipation    Diverticulitis    Diverticulosis    DVT (deep venous thrombosis) (HCC)    Hyperlipidemia    Hypertension    Lactose intolerance    Palpitations     SURGICAL HISTORY: Past Surgical History:  Procedure Laterality Date   BUNIONECTOMY Right 2002   COLONOSCOPY     ESOPHAGOGASTRODUODENOSCOPY ENDOSCOPY     left knee arthroscopic Left    1994- football injury    ROTATOR CUFF REPAIR Right    march 2019   ROTATOR CUFF REPAIR Left    Partial and AC joint repair december 2020    SOCIAL HISTORY: Social History    Socioeconomic History   Marital status: Married    Spouse name: Not on file   Number of children: Not on file   Years of education: Not on file   Highest education level: Doctorate  Occupational History   Not on file  Tobacco Use   Smoking status: Never   Smokeless tobacco: Never  Vaping Use   Vaping status: Never Used  Substance and Sexual Activity   Alcohol use: Yes    Comment: rare   Drug use: Never   Sexual activity: Yes    Partners: Female  Other Topics Concern   Not on file  Social History Narrative   Married 2008. Omega (2014), Evan (2017). Mother in law lives with them.       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 Football for 2 years and was on track team   Doctorate [employer]- global sport leadership- develops international skills but ended  Up with [employer] position   Was Engineering geologist at [employer]: time with family and sons, walking in mornings, date night once a week with wife, church   Social Drivers of Health   Financial Resource Strain: Low Risk  (02/23/2023)   Overall Financial Resource Strain (CARDIA)    Difficulty of Paying  Living Expenses: Not hard at all  Food Insecurity: No Food Insecurity (09/23/2023)   Hunger Vital Sign    Worried About Running Out of Food in the Last Year: Never true    Ran Out of Food in the Last Year: Never true  Transportation Needs: No Transportation Needs (09/23/2023)   PRAPARE - Administrator, Civil Service (Medical): No    Lack of Transportation (Non-Medical): No  Physical Activity: Unknown (02/23/2023)   Exercise Vital Sign    Days of Exercise per Week: 0 days    Minutes of Exercise per Session: Not on file  Stress: No Stress Concern Present (02/23/2023)   [employer] of Occupational Health - Occupational Stress  Questionnaire    Feeling of Stress : Not at all  Social Connections: Socially Integrated (09/23/2023)   Social Connection and Isolation Panel    Frequency of Communication with Friends and Family: More than three times a week    Frequency of Social Gatherings with Friends and Family: More than three times a week    Attends Religious Services: More than 4 times per year    Active Member of [employer] or Organizations: Yes    Attends Banker Meetings: 1 to 4 times per year    Marital Status: Married  Catering manager Violence: Not At Risk (09/23/2023)   Humiliation, Afraid, Rape, and Kick questionnaire    Fear of Current or Ex-Partner: No    Emotionally Abused: No    Physically Abused: No    Sexually Abused: No    FAMILY HISTORY: Family History  Problem Relation Age of Onset   High Cholesterol Mother    Hypertension Mother    High Cholesterol Father    Hypertension Father    Kidney disease Father        agent orange exposure- 100% disability   COPD Father    Colon cancer Sister        rare and typically find in men and boys- died in 8 months.    Cancer Sister    Colon cancer Maternal Aunt    Colon cancer Maternal Grandmother    Alzheimer's disease Paternal Grandmother    Healthy Sister    Other Maternal Grandfather        accidental   Heart attack Paternal Grandfather        early 4s   Colon cancer Paternal Uncle    Pancreatic cancer Neg Hx    Esophageal cancer Neg Hx    Stomach cancer Neg Hx    Liver disease Neg Hx     ALLERGIES:  is allergic to bee pollen, dust mite extract, grass extracts [gramineae pollens], hydrocodone -acetaminophen , and pollen extract.  MEDICATIONS:  Current Outpatient Medications  Medication Sig Dispense Refill   apixaban  (ELIQUIS ) 5 MG TABS tablet Take 1 tablet (5 mg total) by mouth 2 (two) times daily. 60 tablet 1   traMADol  (ULTRAM ) 50 MG tablet Take 1-2 tablets (50-100 mg total) by mouth every 6 (six) hours as needed for moderate  pain (pain score 4-6). 30 tablet 0   No current facility-administered medications for this visit.    REVIEW OF SYSTEMS:   Constitutional: Denies fevers, chills or abnormal night sweats Eyes: Denies blurriness of vision, double vision or watery eyes Ears, nose, mouth, throat, and face: Denies mucositis or sore throat Respiratory: Denies cough, dyspnea or wheezes Cardiovascular: Denies palpitation, chest discomfort or lower extremity swelling Gastrointestinal:  Denies nausea, heartburn or change in bowel habits Skin: Denies  abnormal skin rashes Lymphatics: Denies new lymphadenopathy or easy bruising Neurological:Denies numbness, tingling or new weaknesses Behavioral/Psych: Mood is stable, no new changes  All other systems were reviewed with the patient and are negative.  PHYSICAL EXAMINATION: ECOG PERFORMANCE STATUS: 0 - Asymptomatic  Vitals:   10/13/23 1142 10/13/23 1143  BP: (!) 133/103 (!) 123/95  Pulse: 74   Resp: 17   Temp: 97.6 F (36.4 C)   SpO2: 98%    Filed Weights   10/13/23 1142  Weight: 239 lb 7 oz (108.6 kg)    GENERAL:alert, no distress and comfortable SKIN: skin color, texture, turgor are normal, no rashes or significant lesions EYES: normal, conjunctiva are pink and non-injected, sclera clear OROPHARYNX:no exudate, no erythema and lips, buccal mucosa, and tongue normal  NECK: supple, thyroid normal size, non-tender, without nodularity LYMPH:  no palpable lymphadenopathy in the cervical, axillary or inguinal LUNGS: clear to auscultation and percussion with normal breathing effort HEART: regular rate & rhythm and no murmurs and no lower extremity edema ABDOMEN:abdomen soft, non-tender and normal bowel sounds Musculoskeletal:no cyanosis of digits and no clubbing  PSYCH: alert & oriented x 3 with fluent speech NEURO: no focal motor/sensory deficits  Physical Exam    LABORATORY DATA:  I have reviewed the data as listed    Latest Ref Rng & Units 09/25/2023     4:42 AM 09/24/2023    5:13 AM 09/01/2023    9:59 AM  CBC  WBC 4.0 - 10.5 K/uL 11.7  12.8  11.0   Hemoglobin 13.0 - 17.0 g/dL 88.1  87.6  87.2   Hematocrit 39.0 - 52.0 % 39.2  39.1  40.1   Platelets 150 - 400 K/uL 214  234  275.0     @cmpl @  RADIOGRAPHIC STUDIES: I have personally reviewed the radiological images as listed and agreed with the findings in the report. VAS US  LOWER EXTREMITY VENOUS (DVT) Result Date: 09/30/2023  Lower Venous DVT Study Patient Name:  Mark Alvarado  Date of Exam:   09/30/2023 Medical Rec #: [id number]    Accession #:    [id number] Date of Birth: 08-29-1975    Patient Gender: M Patient Age:   48 years Exam Location:  [address] Procedure:      VAS US  LOWER EXTREMITY VENOUS (DVT) Referring Phys: BERNARDA NED --------------------------------------------------------------------------------  Indications: Pain.  Performing Technologist: Duwaine Hives RVS  Examination Guidelines: A complete evaluation includes B-mode imaging, spectral Doppler, color Doppler, and power Doppler as needed of all accessible portions of each vessel. Bilateral testing is considered an integral part of a complete examination. Limited examinations for reoccurring indications may be performed as noted. The reflux portion of the exam is performed with the patient in reverse Trendelenburg.  +-----+---------------+---------+-----------+----------+--------------+ RIGHTCompressibilityPhasicitySpontaneityPropertiesThrombus Aging +-----+---------------+---------+-----------+----------+--------------+ CFV  Full           Yes      Yes                                 +-----+---------------+---------+-----------+----------+--------------+   +---------+---------------+---------+-----------+----------+-----------------+ LEFT     CompressibilityPhasicitySpontaneityPropertiesThrombus Aging    +---------+---------------+---------+-----------+----------+-----------------+ CFV      Full            Yes      Yes                                    +---------+---------------+---------+-----------+----------+-----------------+  SFJ      Full                                                           +---------+---------------+---------+-----------+----------+-----------------+ FV Prox  Full                                                           +---------+---------------+---------+-----------+----------+-----------------+ FV Mid   Full                                                           +---------+---------------+---------+-----------+----------+-----------------+ FV DistalFull                                                           +---------+---------------+---------+-----------+----------+-----------------+ PFV      Full                                                           +---------+---------------+---------+-----------+----------+-----------------+ POP      Full           Yes      Yes                  rouleaux flow     +---------+---------------+---------+-----------+----------+-----------------+ PTV      Full                                                           +---------+---------------+---------+-----------+----------+-----------------+ PERO     Full                                                           +---------+---------------+---------+-----------+----------+-----------------+ Gastroc  None                                         Age Indeterminate +---------+---------------+---------+-----------+----------+-----------------+     Summary: RIGHT: - No evidence of common femoral vein obstruction.   LEFT: Findings consistent with age indeterminate intramuscular thrombus involving the left gastrocnemius veins. - There is no evidence of deep vein thrombosis in the lower extremity.  - No cystic structure found in the popliteal fossa.  *See table(s)  above for measurements and observations. Electronically signed by  Penne Colorado MD on 09/30/2023 at 4:41:18 PM.    Final     Assessment & Plan Provoked deep vein thrombosis of right lower extremity following recent surgery Provoked DVT in the left gastrocnemius vein following recent sigmoid colon surgery for diverticulitis. The DVT was confirmed via Doppler ultrasound after he experienced pain and tenderness in the left calf muscle post-discharge. The DVT is considered provoked due to recent surgery and decreased postoperative activity. He has a previous similar event following right shoulder surgery without the need for long-term anticoagulation. No family history of blood clots. Otherwise healthy and active. Symptoms resolved within a week of starting Eliquis . - Continue Eliquis  5 mg twice daily for a total of three months. - Call in two additional refills for Eliquis  to [facility] in [place]. - Advise to take baby aspirin 81 mg daily after completing Eliquis  course. - Encourage staying active and wearing compression socks during long travel. - Advise to initiate blood thinner prophylaxis after any future surgeries, especially orthopedic surgeries. - We discussed role of hypercoagulopathy workup.  Given his relatively young age, 2 episodes of postop DVT, I think is reasonable to do.  Will schedule the lab work 1 month after he stops Eliquis .  - Schedule follow-up phone visit in three and a half months. - Cancel routine lab work scheduled for today. - Discuss potential genetic testing for clotting disorders after completion of Eliquis  course.   Plan - I recommend continue Eliquis  for total of 3 months, then changed to aspirin 81 mg daily - Will obtain hypercoagulopathy workup 1 months after he completed the course of Eliquis  - Phone visit after lab work.  Orders Placed This Encounter  Procedures   Antithrombin III   Protein C activity   Protein C, total   Protein S activity   Protein S, total   Lupus anticoagulant panel   Beta-2-glycoprotein  i abs, IgG/M/A   Homocysteine, serum   Factor 5 leiden   Prothrombin gene mutation   Cardiolipin antibodies, IgG, IgM, IgA   D-dimer, quantitative    Standing Status:   Future    Expected Date:   01/13/2024    Expiration Date:   10/12/2024   Antithrombin III    Standing Status:   Future    Expected Date:   01/13/2024    Expiration Date:   04/12/2024   Protein C activity    Standing Status:   Future    Expected Date:   01/13/2024    Expiration Date:   04/12/2024   Protein C, total    Standing Status:   Future    Expected Date:   01/13/2024    Expiration Date:   04/12/2024   Protein S activity    Standing Status:   Future    Expected Date:   01/13/2024    Expiration Date:   04/12/2024   Protein S, total    Standing Status:   Future    Expected Date:   01/13/2024    Expiration Date:   04/12/2024   Lupus anticoagulant panel    Standing Status:   Future    Expected Date:   01/13/2024    Expiration Date:   04/12/2024   Beta-2-glycoprotein i abs, IgG/M/A    Standing Status:   Future    Expected Date:   01/13/2024    Expiration Date:   04/12/2024   Homocysteine, serum    Standing Status:   Future  Expected Date:   01/13/2024    Expiration Date:   04/12/2024   Factor 5 leiden    Standing Status:   Future    Expected Date:   01/13/2024    Expiration Date:   04/12/2024   Prothrombin gene mutation    Standing Status:   Future    Expected Date:   01/13/2024    Expiration Date:   04/12/2024   Cardiolipin antibodies, IgG, IgM, IgA    Standing Status:   Future    Expected Date:   01/13/2024    Expiration Date:   04/12/2024    All questions were answered. The patient knows to call the clinic with any problems, questions or concerns. I spent 25 minutes counseling the patient face to face. The total time spent in the appointment was 30 minutes including review of chart and various tests results, discussions about plan of care and coordination of care plan.     Onita Mattock, MD 10/13/2023  3:04 PM

## 2023-11-14 IMAGING — CT CT ABD-PELV W/ CM
2 of 5 series · 15 of 46 positions shown, 17 images · IV contrast (APPLIED)
Comparison: Plain film from the previous day.

CLINICAL DATA: Left lower quadrant pain for several days

EXAM:
CT ABDOMEN AND PELVIS WITH CONTRAST
TECHNIQUE: Multidetector CT imaging of the abdomen and pelvis was performed
using the standard protocol following bolus administration of
intravenous contrast.

[Series 2: abd pel w · axial · 0.74mm/px · z∈[-428,+52]mm · 12 of 108 slices shown, 14 images]
[im 6/108  soft-tissue]
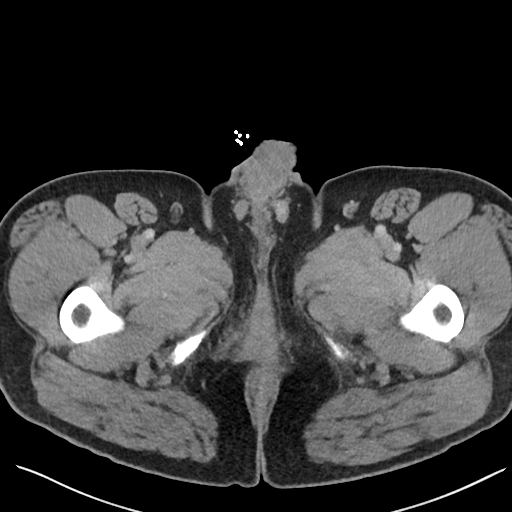
[im 6/108  bone]
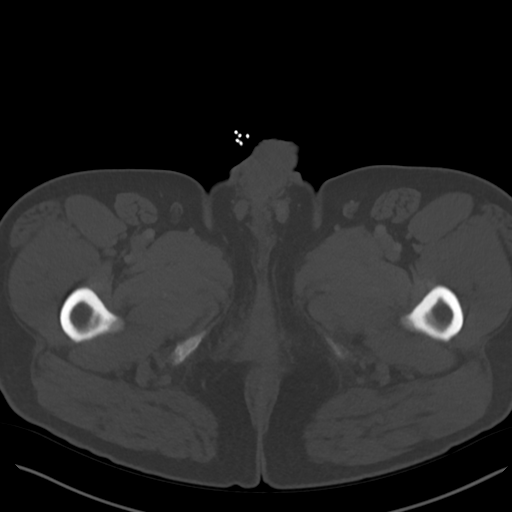
[im 17/108  soft-tissue]
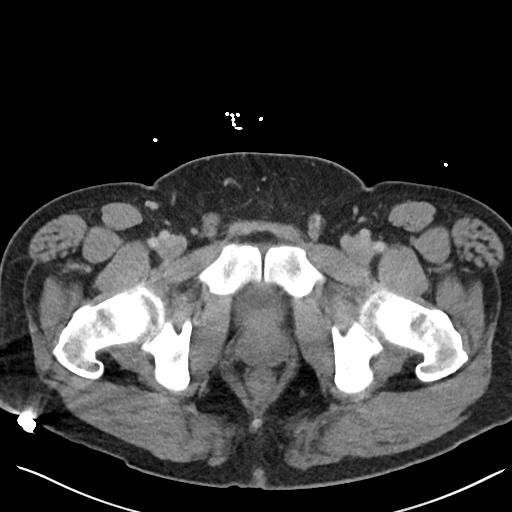
[im 23/108  soft-tissue]
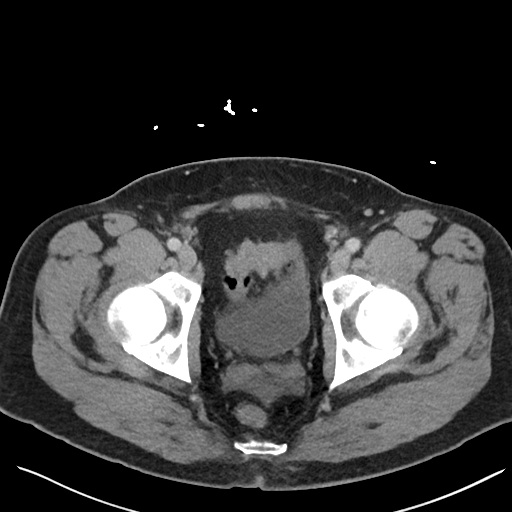
[im 34/108  soft-tissue]
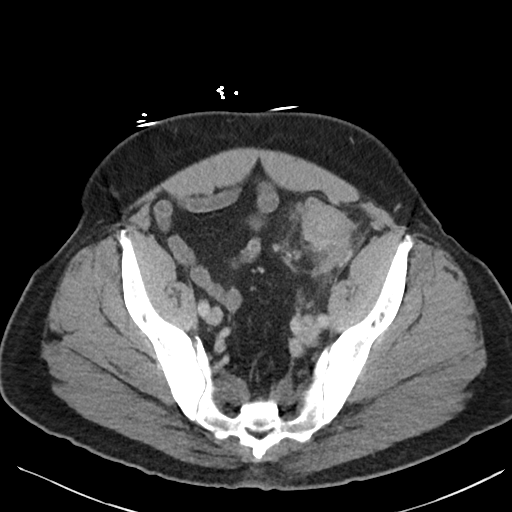
[im 40/108  soft-tissue]
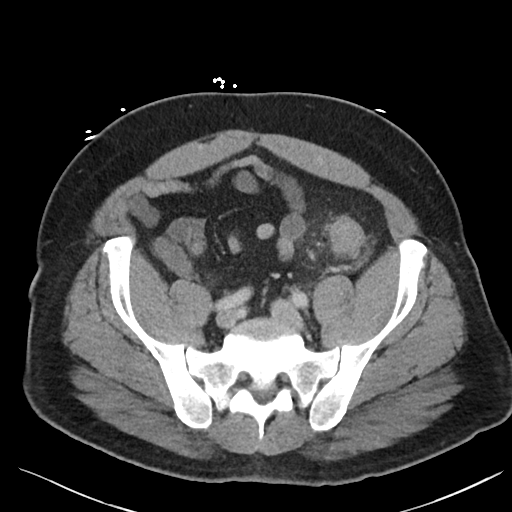
[im 51/108  soft-tissue]
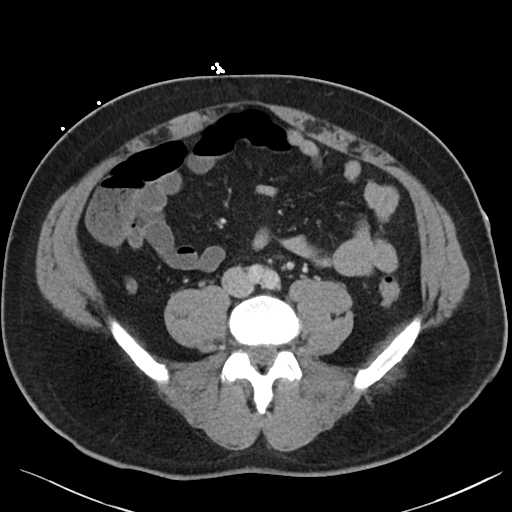
[im 57/108  soft-tissue]
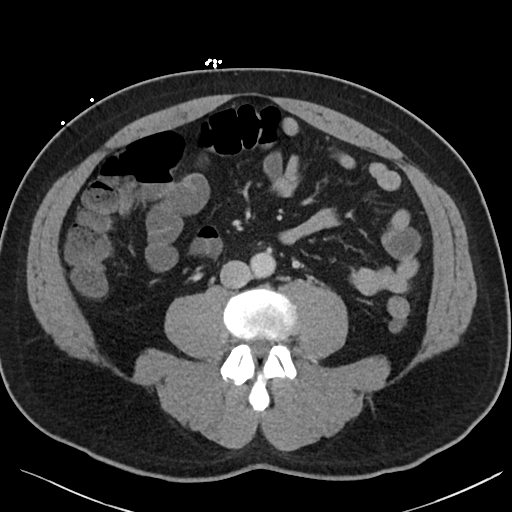
[im 68/108  soft-tissue]
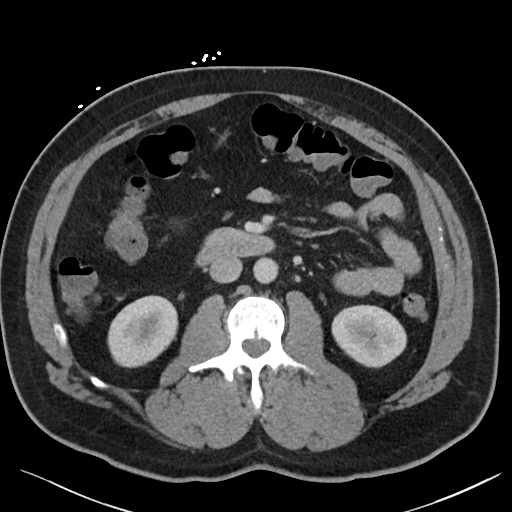
[im 74/108  soft-tissue]
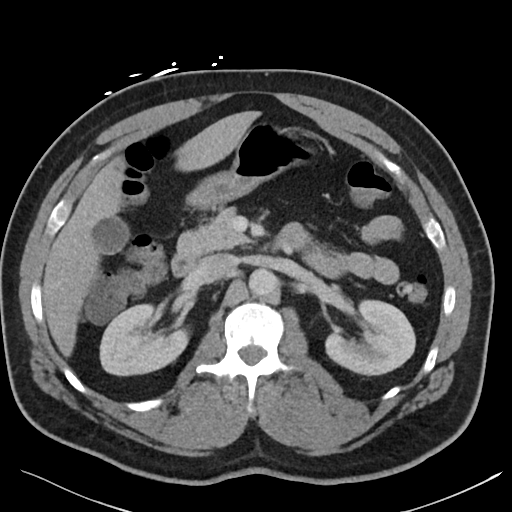
[im 74/108  bone]
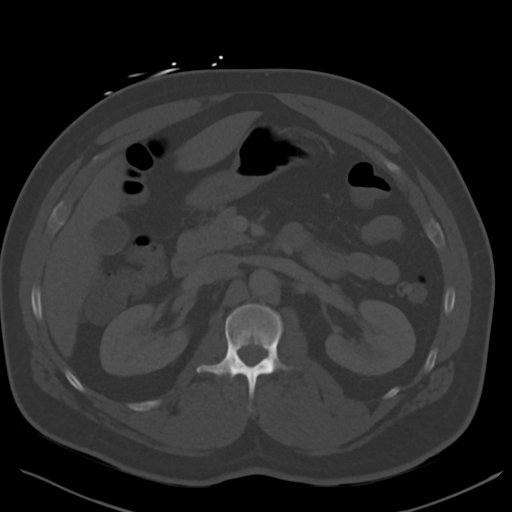
[im 85/108  soft-tissue]
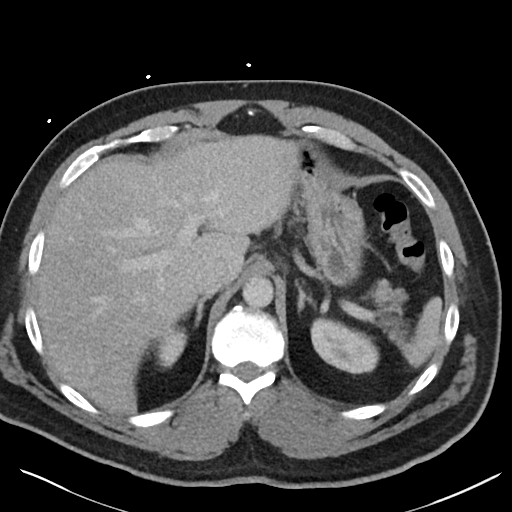
[im 91/108  soft-tissue]
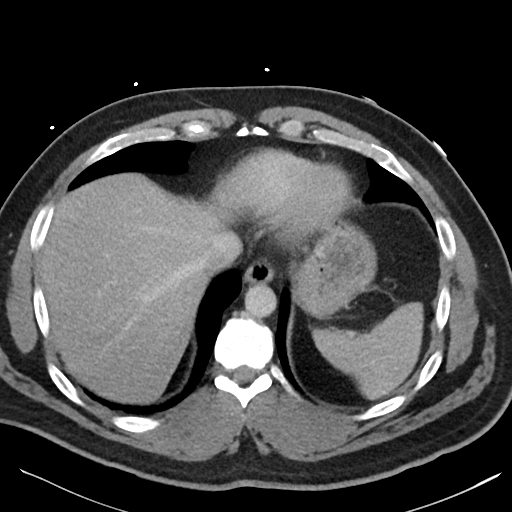
[im 102/108  soft-tissue]
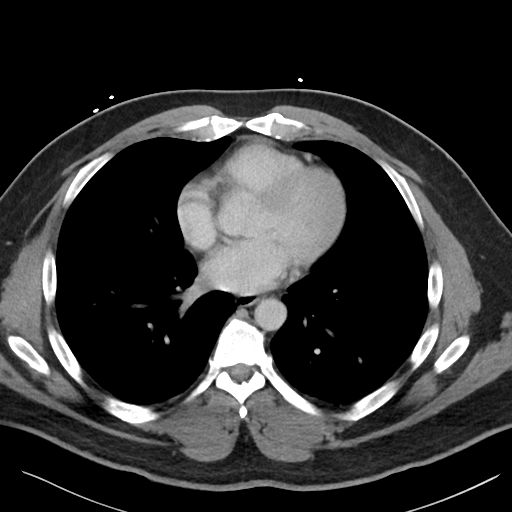

[Series 5: coronal · coronal · 0.78mm/px · 3 of 108 slices shown]
[im 36/108  soft-tissue]
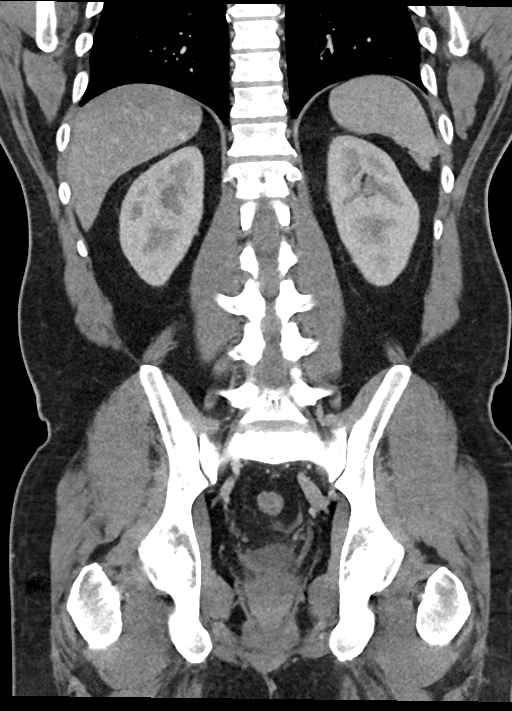
[im 48/108  soft-tissue]
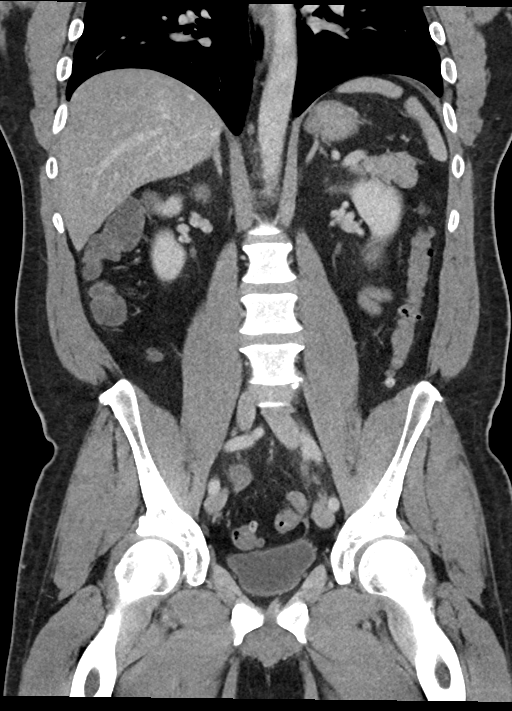
[im 60/108  soft-tissue]
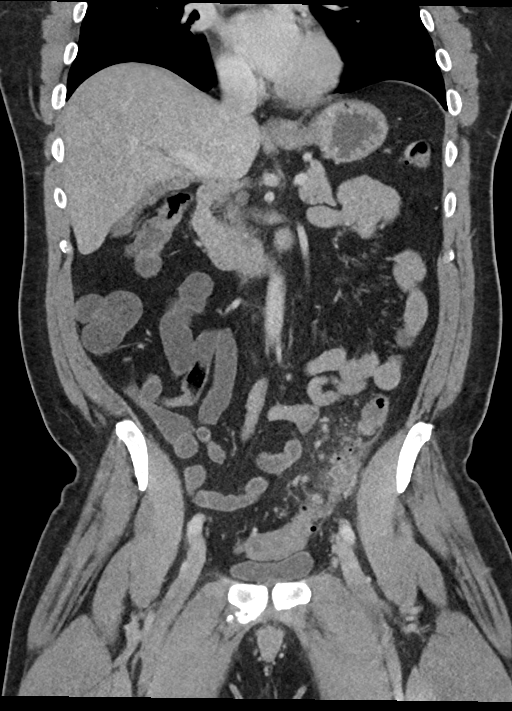

[15 of 46 positions shown; findings below may reference images not displayed]

RADIATION DOSE REDUCTION: This exam was performed according to the
departmental dose-optimization program which includes automated
exposure control, adjustment of the mA and/or kV according to
patient size and/or use of iterative reconstruction technique.

CONTRAST:  100mL OMNIPAQUE IOHEXOL 300 MG/ML  SOLN
FINDINGS: Lower chest: No acute abnormality.

Hepatobiliary: No focal liver abnormality is seen. No gallstones,
gallbladder wall thickening, or biliary dilatation.

Pancreas: Unremarkable. No pancreatic ductal dilatation or
surrounding inflammatory changes.

Spleen: Normal in size without focal abnormality.

Adrenals/Urinary Tract: Adrenal glands are within normal limits.
Kidneys demonstrate a normal enhancement pattern bilaterally. No
renal calculi or obstructive changes are seen. The ureters are
within normal limits. Bladder is partially distended.

Stomach/Bowel: Colon shows color change with evidence of
diverticulitis in the proximal sigmoid just below the junction with
the descending colon. No evidence of perforation or abscess
formation is seen. The more proximal colon is within normal limits.
The appendix is unremarkable. Small bowel and stomach are within
normal limits.

Vascular/Lymphatic: No significant vascular findings are present. No
enlarged abdominal or pelvic lymph nodes.

Reproductive: Prostate is unremarkable.

Other: No abdominal wall hernia or abnormality. No abdominopelvic
ascites.

Musculoskeletal: No acute or significant osseous findings.
IMPRESSION: Changes consistent with sigmoid diverticulitis. No perforation or
abscess formation is noted.

## 2023-11-16 ENCOUNTER — Ambulatory Visit: Payer: Self-pay

## 2023-11-16 NOTE — Telephone Encounter (Signed)
 FYI Only or Action Required?: FYI only for provider.  Patient was last seen in primary care on 09/01/2023 by Katrinka Garnette KIDD, MD.  Called Nurse Triage reporting Dizziness.  Symptoms began several days ago.  Interventions attempted: Nothing.  Symptoms are: stable.  Triage Disposition: See Physician Within 24 Hours - patient going to UC now  Patient/caregiver understands and will follow disposition?: Yes    Copied from CRM #[id number]. Topic: Clinical - Red Word Triage >> Nov 16, 2023 12:14 PM Robinson H wrote: [facility] that prompted transfer to Nurse Triage: [place] 3 weeks ago, last couple of days been dizzy like balance off. Back was hurting Reason for Disposition  [1] MODERATE dizziness (e.g., vertigo; feels very unsteady, interferes with normal activities) AND [2] has NOT been evaluated by doctor (or NP/PA) for this  Answer Assessment - Initial Assessment Questions Patient states he does not have any dizziness when he is sitting down or driving, only when walking Patient denies numbness/tingling in arms or legs, vision changes, difficulty with speech, history of stroke Feels he is leaning more towards right or left when he is walking. Patient reports he fell 3 weeks ago and denies hitting head.   1. DESCRIPTION: Describe your dizziness.     Patient feels off balance  2. VERTIGO: Do you feel like either you or the room is spinning or tilting?      No 3. LIGHTHEADED: Do you feel lightheaded? (e.g., somewhat faint, woozy, weak upon standing)     Feels off balance, does not feel faint.  4. SEVERITY: How bad is it?  Can you walk?     Patient states he is having difficulty not walking into people 5. ONSET:  When did the dizziness begin?     Friday 6. AGGRAVATING FACTORS: Does anything make it worse? (e.g., standing, change in head position)     Patient states it only happens when he is trying to walk 7. CAUSE: What do you think is causing the dizziness?     Patient  is uncertain if this is related to fall he had 3 weeks ago but denies hitting head on the ground, states he had to clench his neck tightly to avoid impact of head when he fell 8. RECURRENT SYMPTOM: Have you had dizziness before? If Yes, ask: When was the last time? What happened that time?     No 9. OTHER SYMPTOMS: Do you have any other symptoms? (e.g., earache, headache, numbness, tinnitus, vomiting, weakness)     Patient initially had pain in groin and buttocks but it has revolved  Protocols used: Dizziness - Vertigo-A-AH

## 2023-11-17 NOTE — Telephone Encounter (Signed)
 Noted

## 2023-12-25 ENCOUNTER — Other Ambulatory Visit: Payer: Self-pay | Admitting: Hematology

## 2023-12-29 ENCOUNTER — Other Ambulatory Visit: Payer: Self-pay

## 2023-12-29 DIAGNOSIS — Z86718 Personal history of other venous thrombosis and embolism: Secondary | ICD-10-CM

## 2023-12-30 ENCOUNTER — Other Ambulatory Visit: Payer: Self-pay

## 2023-12-30 ENCOUNTER — Inpatient Hospital Stay: Attending: Hematology

## 2023-12-30 DIAGNOSIS — I82462 Acute embolism and thrombosis of left calf muscular vein: Secondary | ICD-10-CM | POA: Diagnosis not present

## 2023-12-30 DIAGNOSIS — Z86718 Personal history of other venous thrombosis and embolism: Secondary | ICD-10-CM

## 2023-12-30 LAB — CBC WITH DIFFERENTIAL (CANCER CENTER ONLY)
Abs Immature Granulocytes: 0.01 K/uL (ref 0.00–0.07)
Basophils Absolute: 0 K/uL (ref 0.0–0.1)
Basophils Relative: 1 %
Eosinophils Absolute: 0.2 K/uL (ref 0.0–0.5)
Eosinophils Relative: 3 %
HCT: 44.9 % (ref 39.0–52.0)
Hemoglobin: 14.7 g/dL (ref 13.0–17.0)
Immature Granulocytes: 0 %
Lymphocytes Relative: 40 %
Lymphs Abs: 2.3 K/uL (ref 0.7–4.0)
MCH: 27.3 pg (ref 26.0–34.0)
MCHC: 32.7 g/dL (ref 30.0–36.0)
MCV: 83.5 fL (ref 80.0–100.0)
Monocytes Absolute: 0.4 K/uL (ref 0.1–1.0)
Monocytes Relative: 7 %
Neutro Abs: 2.8 K/uL (ref 1.7–7.7)
Neutrophils Relative %: 49 %
Platelet Count: 252 K/uL (ref 150–400)
RBC: 5.38 MIL/uL (ref 4.22–5.81)
RDW: 13.6 % (ref 11.5–15.5)
WBC Count: 5.7 K/uL (ref 4.0–10.5)
nRBC: 0 % (ref 0.0–0.2)

## 2023-12-30 LAB — PROTIME-INR
INR: 0.9 (ref 0.8–1.2)
Prothrombin Time: 13.2 s (ref 11.4–15.2)

## 2023-12-30 LAB — ANTITHROMBIN III: AntiThromb III Func: 107 % (ref 75–120)

## 2023-12-30 LAB — D-DIMER, QUANTITATIVE: D-Dimer, Quant: <0.27 ug{FEU}/mL (ref 0.00–0.50)

## 2023-12-31 LAB — HOMOCYSTEINE: Homocysteine: 11.1 umol/L (ref 0.0–14.5)

## 2024-01-01 LAB — PROTEIN C ACTIVITY: Protein C Activity: 107 % (ref 73–180)

## 2024-01-01 LAB — LUPUS ANTICOAGULANT PANEL
DRVVT: 45 s (ref 0.0–47.0)
PTT Lupus Anticoagulant: 40.7 s (ref 0.0–43.5)

## 2024-01-01 LAB — PROTEIN S ACTIVITY: Protein S Activity: 95 % (ref 63–140)

## 2024-01-01 LAB — BETA-2-GLYCOPROTEIN I ABS, IGG/M/A
Beta-2 Glyco I IgG: <9 GPI IgG units (ref 0–20)
Beta-2-Glycoprotein I IgA: <9 GPI IgA units (ref 0–25)
Beta-2-Glycoprotein I IgM: <9 GPI IgM units (ref 0–32)

## 2024-01-01 LAB — PROTEIN S, TOTAL: Protein S Ag, Total: 93 % (ref 60–150)

## 2024-01-01 LAB — PROTEIN C, TOTAL: Protein C, Total: 87 % (ref 60–150)

## 2024-01-01 LAB — CARDIOLIPIN ANTIBODIES, IGG, IGM, IGA
Anticardiolipin IgA: <9 U/mL (ref 0–11)
Anticardiolipin IgG: <9 GPL U/mL (ref 0–14)
Anticardiolipin IgM: <9 [MPL'U]/mL (ref 0–12)

## 2024-01-04 LAB — FACTOR 5 LEIDEN

## 2024-01-06 LAB — PROTHROMBIN GENE MUTATION

## 2024-01-13 ENCOUNTER — Inpatient Hospital Stay: Admitting: Hematology

## 2024-01-13 DIAGNOSIS — I82462 Acute embolism and thrombosis of left calf muscular vein: Secondary | ICD-10-CM | POA: Diagnosis not present

## 2024-01-13 DIAGNOSIS — Z86718 Personal history of other venous thrombosis and embolism: Secondary | ICD-10-CM

## 2024-01-13 NOTE — Progress Notes (Signed)
 [facility]   Telephone:([phone]) [phone] Fax:(336) [phone]   Clinic Follow up Note   Patient Care Team: Katrinka Garnette KIDD, MD as PCP - General (Family Medicine) Lavona Agent, MD as Consulting Physician (Cardiology) 01/13/2024  I connected with Mark Alvarado on 01/13/24 at  8:40 AM EST by telephone and verified that I am speaking with the correct person using two identifiers.   I discussed the limitations, risks, security and privacy concerns of performing an evaluation and management service by telephone and the availability of in person appointments. I also discussed with the patient that there may be a patient responsible charge related to this service. The patient expressed understanding and agreed to proceed.   Patient's location:  Home  Provider's location:  Office    CHIEF COMPLAINT: f/u lab results   Assessment & Plan History of left lower extremity deep vein thrombosis after surgery Post-surgical left lower extremity DVT with negative hypercoagulopahty tests. Normal D-dimer levels indicate low risk for future thromboembolic events. No current need for anticoagulation therapy. - Continue baby aspirin. - Maintain physical activity.  We discussed strategy to prevent thrombosis in future - Consider short course of anticoagulation post-surgery, especially orthopedic, to prevent thromboembolic events. - Advised on mobility during long-distance travel to reduce thromboembolic risk. -f/u as needed      Discussed the use of AI scribe software for clinical note transcription with the patient, who gave verbal consent to proceed.  History of Present Illness Mark Alvarado is a 48 year old male who presents for a phone visit to review test results after a history of left lower extremity DVT following surgery.  He experienced a left lower extremity deep vein thrombosis (DVT) following surgery a few months ago and was treated with Eliquis  for three months, which he has since  discontinued. Recent blood tests, including genetic mutation screening and antibody tests, returned normal or negative results. A D-dimer test also came back normal. He feels well and has not experienced any flare-ups since his colon surgery. He does not smoke or drink alcohol.     REVIEW OF SYSTEMS:   Constitutional: Denies fevers, chills or abnormal weight loss Eyes: Denies blurriness of vision Ears, nose, mouth, throat, and face: Denies mucositis or sore throat Respiratory: Denies cough, dyspnea or wheezes Cardiovascular: Denies palpitation, chest discomfort or lower extremity swelling Gastrointestinal:  Denies nausea, heartburn or change in bowel habits Skin: Denies abnormal skin rashes Lymphatics: Denies new lymphadenopathy or easy bruising Neurological:Denies numbness, tingling or new weaknesses Behavioral/Psych: Mood is stable, no new changes  All other systems were reviewed with the patient and are negative.  MEDICAL HISTORY:  Past Medical History:  Diagnosis Date   Allergy    Asthma    As a child   Chronic constipation    Diverticulitis    Diverticulosis    DVT (deep venous thrombosis) (HCC)    Hyperlipidemia    Hypertension    Lactose intolerance    Palpitations     SURGICAL HISTORY: Past Surgical History:  Procedure Laterality Date   BUNIONECTOMY Right 2002   COLONOSCOPY     ESOPHAGOGASTRODUODENOSCOPY ENDOSCOPY     left knee arthroscopic Left    1994- football injury    ROTATOR CUFF REPAIR Right    march 2019   ROTATOR CUFF REPAIR Left    Partial and AC joint repair december 2020    I have reviewed the social history and family history with the patient and they are unchanged from  previous note.  ALLERGIES:  is allergic to bee pollen, dust mite extract, grass extracts [gramineae pollens], hydrocodone -acetaminophen , and pollen extract.  MEDICATIONS:  Current Outpatient Medications  Medication Sig Dispense Refill   ELIQUIS  5 MG TABS tablet TAKE 1  TABLET(5 MG) BY MOUTH TWICE DAILY 60 tablet 1   traMADol  (ULTRAM ) 50 MG tablet Take 1-2 tablets (50-100 mg total) by mouth every 6 (six) hours as needed for moderate pain (pain score 4-6). 30 tablet 0   No current facility-administered medications for this visit.    PHYSICAL EXAMINATION: Not performed   LABORATORY DATA:  I have reviewed the data as listed    Latest Ref Rng & Units 12/30/2023    8:03 AM 09/25/2023    4:42 AM 09/24/2023    5:13 AM  CBC  WBC 4.0 - 10.5 K/uL 5.7  11.7  12.8   Hemoglobin 13.0 - 17.0 g/dL 85.2  88.1  87.6   Hematocrit 39.0 - 52.0 % 44.9  39.2  39.1   Platelets 150 - 400 K/uL 252  214  234         Latest Ref Rng & Units 09/25/2023    4:42 AM 09/24/2023    5:13 AM 09/01/2023    9:59 AM  CMP  Glucose 70 - 99 mg/dL 92  891  80   BUN 6 - 20 mg/dL 18  12  17    Creatinine 0.61 - 1.24 mg/dL 9.13  8.88  8.98   Sodium 135 - 145 mmol/L 139  137  139   Potassium 3.5 - 5.1 mmol/L 3.9  4.5  4.9   Chloride 98 - 111 mmol/L 105  106  103   CO2 22 - 32 mmol/L 26  25  27    Calcium  8.9 - 10.3 mg/dL 8.4  8.8  9.1   Total Protein 6.0 - 8.3 g/dL   7.4   Total Bilirubin 0.2 - 1.2 mg/dL   0.5   Alkaline Phos 39 - 117 U/L   46   AST 0 - 37 U/L   22   ALT 0 - 53 U/L   17       RADIOGRAPHIC STUDIES: I have personally reviewed the radiological images as listed and agreed with the findings in the report. No results found.     I discussed the assessment and treatment plan with the patient. The patient was provided an opportunity to ask questions and all were answered. The patient agreed with the plan and demonstrated an understanding of the instructions.   The patient was advised to call back or seek an in-person evaluation if the symptoms worsen or if the condition fails to improve as anticipated.  I provided 10 minutes of non face-to-face telephone visit time during this encounter, including review of chart and various tests results, discussions about plan of care and  coordination of care plan.    Onita Mattock, MD 01/13/24
